# Patient Record
Sex: Male | Born: 1990 | Race: Black or African American | Hispanic: No | Marital: Single | Smoking: Never smoker
Health system: Southern US, Community
[De-identification: ages and names within clinical notes are randomized; demographics above are authoritative.]

---

## 2020-02-21 ENCOUNTER — Encounter: Payer: Self-pay | Admitting: Emergency Medicine

## 2020-02-21 ENCOUNTER — Emergency Department: Payer: BC Managed Care – PPO | Admitting: Anesthesiology

## 2020-02-21 ENCOUNTER — Encounter
Admission: EM | Disposition: A | Discharge status: Home / Self Care | Payer: Self-pay | Source: Home / Self Care | Attending: Emergency Medicine

## 2020-02-21 ENCOUNTER — Ambulatory Visit
Admission: EM | Admit: 2020-02-21 | Discharge: 2020-02-21 | Disposition: A | Discharge status: Home / Self Care | Payer: BC Managed Care – PPO | Attending: Family Medicine | Admitting: Family Medicine

## 2020-02-21 ENCOUNTER — Ambulatory Visit
Admission: RE | Admit: 2020-02-21 | Discharge: 2020-02-21 | Disposition: A | Discharge status: Home / Self Care | Payer: BC Managed Care – PPO | Source: Ambulatory Visit | Attending: Family Medicine | Admitting: Family Medicine

## 2020-02-21 ENCOUNTER — Other Ambulatory Visit: Payer: Self-pay

## 2020-02-21 ENCOUNTER — Ambulatory Visit
Admission: EM | Admit: 2020-02-21 | Discharge: 2020-02-22 | Disposition: A | Discharge status: Home / Self Care | Payer: BC Managed Care – PPO | Attending: Emergency Medicine | Admitting: Emergency Medicine

## 2020-02-21 DIAGNOSIS — N4400 Torsion of testis, unspecified: Secondary | ICD-10-CM | POA: Diagnosis not present

## 2020-02-21 DIAGNOSIS — N5089 Other specified disorders of the male genital organs: Secondary | ICD-10-CM

## 2020-02-21 DIAGNOSIS — N50811 Right testicular pain: Secondary | ICD-10-CM | POA: Insufficient documentation

## 2020-02-21 DIAGNOSIS — N433 Hydrocele, unspecified: Secondary | ICD-10-CM | POA: Insufficient documentation

## 2020-02-21 DIAGNOSIS — Z20822 Contact with and (suspected) exposure to covid-19: Secondary | ICD-10-CM | POA: Diagnosis not present

## 2020-02-21 HISTORY — PX: SCROTAL EXPLORATION: SHX2386

## 2020-02-21 HISTORY — PX: ORCHIECTOMY: SHX2116

## 2020-02-21 HISTORY — PX: ORCHIOPEXY: SHX479

## 2020-02-21 LAB — BASIC METABOLIC PANEL
Anion gap: 14 (ref 5–15)
BUN: 15 mg/dL (ref 6–20)
CO2: 25 mmol/L (ref 22–32)
Calcium: 9.1 mg/dL (ref 8.9–10.3)
Chloride: 105 mmol/L (ref 98–111)
Creatinine, Ser: 1.01 mg/dL (ref 0.61–1.24)
GFR calc Af Amer: >60 mL/min (ref >60)
GFR calc non Af Amer: >60 mL/min (ref >60)
Glucose, Bld: 100 mg/dL — ABNORMAL HIGH (ref 70–99)
Potassium: 3.8 mmol/L (ref 3.5–5.1)
Sodium: 144 mmol/L (ref 135–145)

## 2020-02-21 LAB — CBC
HCT: 39.8 % (ref 39.0–52.0)
Hemoglobin: 12.5 g/dL — ABNORMAL LOW (ref 13.0–17.0)
MCH: 22 pg — ABNORMAL LOW (ref 26.0–34.0)
MCHC: 31.4 g/dL (ref 30.0–36.0)
MCV: 69.9 fL — ABNORMAL LOW (ref 80.0–100.0)
Platelets: 245 10*3/uL (ref 150–400)
RBC: 5.69 MIL/uL (ref 4.22–5.81)
RDW: 15.6 % — ABNORMAL HIGH (ref 11.5–15.5)
WBC: 7.9 10*3/uL (ref 4.0–10.5)
nRBC: 0 % (ref 0.0–0.2)

## 2020-02-21 LAB — SARS CORONAVIRUS 2 BY RT PCR (HOSPITAL ORDER, PERFORMED IN ~~LOC~~ HOSPITAL LAB): SARS Coronavirus 2: NEGATIVE

## 2020-02-21 SURGERY — ORCHIOPEXY ADULT
Anesthesia: General | Site: Scrotum | Laterality: Right

## 2020-02-21 MED ORDER — DOCUSATE SODIUM 100 MG PO CAPS: 100.0000 mg | ORAL_CAPSULE | Freq: Two times a day (BID) | ORAL | 0 refills | Status: DC

## 2020-02-21 MED ORDER — CEFAZOLIN (ANCEF) 1 G IV SOLR: 2.0000 g | INTRAVENOUS | Status: DC

## 2020-02-21 MED ORDER — ONDANSETRON HCL 4 MG/2ML IJ SOLN
INTRAMUSCULAR | Status: DC | PRN
  Administered 2020-02-21: 4 mg via INTRAVENOUS

## 2020-02-21 MED ORDER — MIDAZOLAM HCL 2 MG/2ML IJ SOLN
INTRAMUSCULAR | Status: DC | PRN
  Administered 2020-02-21: 2 mg via INTRAVENOUS

## 2020-02-21 MED ORDER — HYDROCODONE-ACETAMINOPHEN 5-325 MG PO TABS: 1.0000 | ORAL_TABLET | Freq: Four times a day (QID) | ORAL | 0 refills | Status: DC | PRN

## 2020-02-21 MED ORDER — ONDANSETRON HCL 4 MG/2ML IJ SOLN
INTRAMUSCULAR | Status: AC
  Filled 2020-02-21: qty 2

## 2020-02-21 MED ORDER — BUPIVACAINE HCL 0.5 % IJ SOLN
INTRAMUSCULAR | Status: DC | PRN
  Administered 2020-02-21: 10 mL

## 2020-02-21 MED ORDER — LACTATED RINGERS IV SOLN: INTRAVENOUS | Status: DC | PRN

## 2020-02-21 MED ORDER — CEFAZOLIN SODIUM-DEXTROSE 2-4 GM/100ML-% IV SOLN
2.0000 g | Freq: Once | INTRAVENOUS | Status: AC
  Administered 2020-02-21: 2 g via INTRAVENOUS
  Filled 2020-02-21 (×2): qty 100

## 2020-02-21 MED ORDER — ACETAMINOPHEN 10 MG/ML IV SOLN
INTRAVENOUS | Status: AC
  Filled 2020-02-21: qty 100

## 2020-02-21 MED ORDER — FENTANYL CITRATE (PF) 100 MCG/2ML IJ SOLN
INTRAMUSCULAR | Status: AC
  Filled 2020-02-21: qty 2

## 2020-02-21 MED ORDER — DEXAMETHASONE SODIUM PHOSPHATE 10 MG/ML IJ SOLN
INTRAMUSCULAR | Status: AC
  Filled 2020-02-21: qty 1

## 2020-02-21 MED ORDER — BUPIVACAINE HCL (PF) 0.5 % IJ SOLN
INTRAMUSCULAR | Status: AC
  Filled 2020-02-21: qty 30

## 2020-02-21 MED ORDER — PROPOFOL 10 MG/ML IV BOLUS
INTRAVENOUS | Status: AC
  Filled 2020-02-21: qty 20

## 2020-02-21 MED ORDER — DEXMEDETOMIDINE (PRECEDEX) IN NS 20 MCG/5ML (4 MCG/ML) IV SYRINGE
PREFILLED_SYRINGE | INTRAVENOUS | Status: AC
  Filled 2020-02-21: qty 5

## 2020-02-21 MED ORDER — DEXAMETHASONE SODIUM PHOSPHATE 10 MG/ML IJ SOLN
INTRAMUSCULAR | Status: DC | PRN
  Administered 2020-02-21: 10 mg via INTRAVENOUS

## 2020-02-21 MED ORDER — LIDOCAINE HCL (CARDIAC) PF 100 MG/5ML IV SOSY
PREFILLED_SYRINGE | INTRAVENOUS | Status: DC | PRN
  Administered 2020-02-21: 80 mg via INTRAVENOUS

## 2020-02-21 MED ORDER — MIDAZOLAM HCL 2 MG/2ML IJ SOLN
INTRAMUSCULAR | Status: AC
  Filled 2020-02-21: qty 2

## 2020-02-21 MED ORDER — FENTANYL CITRATE (PF) 100 MCG/2ML IJ SOLN
INTRAMUSCULAR | Status: DC | PRN
Start: 2020-02-21 — End: 2020-02-22
  Administered 2020-02-21 (×4): 50 ug via INTRAVENOUS

## 2020-02-21 MED ORDER — SUCCINYLCHOLINE CHLORIDE 200 MG/10ML IV SOSY
PREFILLED_SYRINGE | INTRAVENOUS | Status: AC
  Filled 2020-02-21: qty 10

## 2020-02-21 MED ORDER — PROPOFOL 10 MG/ML IV BOLUS
INTRAVENOUS | Status: DC | PRN
  Administered 2020-02-21: 200 mg via INTRAVENOUS

## 2020-02-21 MED ORDER — ACETAMINOPHEN 10 MG/ML IV SOLN
INTRAVENOUS | Status: DC | PRN
  Administered 2020-02-21: 1000 mg via INTRAVENOUS

## 2020-02-21 MED ORDER — DEXMEDETOMIDINE HCL 200 MCG/2ML IV SOLN
INTRAVENOUS | Status: DC | PRN
  Administered 2020-02-21: 8 ug via INTRAVENOUS
  Administered 2020-02-21: 12 ug via INTRAVENOUS

## 2020-02-21 SURGICAL SUPPLY — 59 items
BLADE CLIPPER SURG (BLADE) ×4 IMPLANT
BLADE SURG 15 STRL LF DISP TIS (BLADE) ×3 IMPLANT
BLADE SURG 15 STRL SS (BLADE) ×1
CANISTER SUCT 1200ML W/VALVE (MISCELLANEOUS) ×4 IMPLANT
CHLORAPREP W/TINT 26 (MISCELLANEOUS) ×8 IMPLANT
COVER WAND RF STERILE (DRAPES) IMPLANT
DERMABOND ADVANCED (GAUZE/BANDAGES/DRESSINGS) ×1
DERMABOND ADVANCED .7 DNX12 (GAUZE/BANDAGES/DRESSINGS) ×3 IMPLANT
DRAIN PENROSE 1/4X12 LTX STRL (WOUND CARE) ×4 IMPLANT
DRAIN PENROSE 5/8X18 LTX STRL (WOUND CARE) IMPLANT
DRAPE LAPAROTOMY 77X122 PED (DRAPES) ×4 IMPLANT
DRSG GAUZE FLUFF 36X18 (GAUZE/BANDAGES/DRESSINGS) ×4 IMPLANT
DRSG TEGADERM 4X4.75 (GAUZE/BANDAGES/DRESSINGS) IMPLANT
DRSG TELFA 4X3 1S NADH ST (GAUZE/BANDAGES/DRESSINGS) IMPLANT
ELECT REM PT RETURN 9FT ADLT (ELECTROSURGICAL) ×4
ELECTRODE REM PT RTRN 9FT ADLT (ELECTROSURGICAL) ×3 IMPLANT
GAUZE 4X4 16PLY RFD (DISPOSABLE) ×4 IMPLANT
GEL ULTRASOUND 8.5OZ AQUASONIC (MISCELLANEOUS) ×4 IMPLANT
GLOVE BIO SURGEON STRL SZ 6.5 (GLOVE) ×8 IMPLANT
GLOVE BIOGEL PI IND STRL 6.5 (GLOVE) IMPLANT
GLOVE BIOGEL PI INDICATOR 6.5 (GLOVE)
GOWN STRL REUS W/ TWL LRG LVL3 (GOWN DISPOSABLE) ×6 IMPLANT
GOWN STRL REUS W/TWL LRG LVL3 (GOWN DISPOSABLE) ×2
KIT TURNOVER KIT A (KITS) ×4 IMPLANT
LABEL OR SOLS (LABEL) ×4 IMPLANT
NDL HPO THNWL 1X22GA REG BVL (NEEDLE) ×3 IMPLANT
NEEDLE HYPO 25X1 1.5 SAFETY (NEEDLE) ×4 IMPLANT
NEEDLE SAFETY 22GX1 (NEEDLE) ×1
NS IRRIG 500ML POUR BTL (IV SOLUTION) ×4 IMPLANT
PACK BASIN MINOR (MISCELLANEOUS) ×4 IMPLANT
SOL PREP PVP 2OZ (MISCELLANEOUS)
SOLUTION PREP PVP 2OZ (MISCELLANEOUS) IMPLANT
SPONGE KITTNER 5P (MISCELLANEOUS) IMPLANT
STRIP CLOSURE SKIN 1/2X4 (GAUZE/BANDAGES/DRESSINGS) IMPLANT
SUPPORETR ATHLETIC LG (MISCELLANEOUS) IMPLANT
SUPPORTER ATHLETIC LG (MISCELLANEOUS)
SUT CHROMIC 3 0 PS 2 (SUTURE) ×4 IMPLANT
SUT CHROMIC 4 0 RB 1X27 (SUTURE) IMPLANT
SUT ETHIBOND 3 0 SH 1 (SUTURE) ×8 IMPLANT
SUT ETHILON 3-0 FS-10 30 BLK (SUTURE) ×4
SUT MNCRL 4-0 (SUTURE)
SUT MNCRL 4-0 27XMFL (SUTURE)
SUT MON AB 3-0 SH 27 (SUTURE) IMPLANT
SUT PROLENE 4 0 PS 2 18 (SUTURE) IMPLANT
SUT SILK 0 (SUTURE) ×1
SUT SILK 0 30XBRD TIE 6 (SUTURE) ×3 IMPLANT
SUT SILK 0 SH 30 (SUTURE) ×4 IMPLANT
SUT SILK 2 0 SH (SUTURE) IMPLANT
SUT VIC AB 3-0 SH 27 (SUTURE) ×1
SUT VIC AB 3-0 SH 27X BRD (SUTURE) ×3 IMPLANT
SUT VIC AB 4-0 FS2 27 (SUTURE) IMPLANT
SUT VIC AB 4-0 SH 27 (SUTURE) ×1
SUT VIC AB 4-0 SH 27XANBCTRL (SUTURE) ×3 IMPLANT
SUT VICRYL PLUS ABS 0 54 (SUTURE) IMPLANT
SUTURE EHLN 3-0 FS-10 30 BLK (SUTURE) ×3 IMPLANT
SUTURE MNCRL 4-0 27XMF (SUTURE) IMPLANT
SYR 10ML LL (SYRINGE) ×4 IMPLANT
SYR 20ML LL LF (SYRINGE) ×4 IMPLANT
WATER STERILE IRR 1000ML POUR (IV SOLUTION) ×4 IMPLANT

## 2020-02-21 NOTE — ED Notes (Signed)
Pt to OR.

## 2020-02-21 NOTE — Discharge Instructions (Signed)
I will call with the results.  Go straight to the hospital for [place].  Take care  Dr. Adriana Simas

## 2020-02-21 NOTE — ED Provider Notes (Signed)
[facility]    CSN: [id number] Arrival date & time: 02/21/20  1716      History   Chief Complaint Chief Complaint  Patient presents with  . Groin Swelling   HPI  29 year old male presents with right testicle swelling.  Patient reports that this started 3 to 4 days ago.  He reports he developed right-sided testicular pain and associated scrotal swelling.  He states that his pain is currently 7/10 in severity.  Patient states that he thought that this would slightly improved but it has not.  He is sexually active.  He is not using protection.  No pain of the left testicle.  No penile discharge.  No urinary symptoms.  No other complaints at this time.  Home Medications    Prior to Admission medications   Not on File   Social History Social History   Tobacco Use  . Smoking status: Never Smoker  . Smokeless tobacco: Never Used  Substance Use Topics  . Alcohol use: Not Currently  . Drug use: Never     Allergies   Patient has no known allergies.   Review of Systems Review of Systems  Genitourinary: Positive for scrotal swelling and testicular pain.   Physical Exam Triage Vital Signs ED Triage Vitals  Enc Vitals Group     BP 02/21/20 1740 (!) 159/86     Pulse Rate 02/21/20 1740 81     Resp 02/21/20 1740 18     Temp 02/21/20 1740 98.5 F (36.9 C)     Temp Source 02/21/20 1740 Oral     SpO2 02/21/20 1740 100 %     Weight 02/21/20 1741 171 lb (77.6 kg)     Height 02/21/20 1741 6\' 2"  (1.88 m)     Head Circumference --      Peak Flow --      Pain Score 02/21/20 1740 7     Pain Loc --      Pain Edu? --      Excl. in GC? --    Updated Vital Signs BP (!) 159/86   Pulse 81   Temp 98.5 F (36.9 C) (Oral)   Resp 18   Ht 6\' 2"  (1.88 m)   Wt 77.6 kg   SpO2 100%   BMI 21.96 kg/m   Visual Acuity Right Eye Distance:   Left Eye Distance:   Bilateral Distance:    Right Eye Near:   Left Eye Near:    Bilateral Near:     Physical Exam Vitals and  nursing note reviewed.  Constitutional:      General: He is not in acute distress.    Appearance: Normal appearance. He is not ill-appearing.  Eyes:     General:        Right eye: No discharge.        Left eye: No discharge.     Conjunctiva/sclera: Conjunctivae normal.  Pulmonary:     Effort: Pulmonary effort is normal. No respiratory distress.  Genitourinary:    Comments: Left testicle normal.  Right testicle is very edematous.  Cannot really discern any of his anatomy due to the amount/extent of swelling. Neurological:     Mental Status: He is alert.  Psychiatric:        Mood and Affect: Mood normal.        Behavior: Behavior normal.    UC Treatments / Results  Labs (all labs ordered are listed, but only abnormal results are displayed) Labs Reviewed - No  data to display  EKG   Radiology US SCROTUM W/DOPPLER  Addendum Date: 02/21/2020   ADDENDUM REPORT: 02/21/2020 19:32 ADDENDUM: There is an error in the body of the report. It should state that pulsed Doppler interrogation of both testicles demonstrates normal venous and arterial waveforms within the left testicle. However on the right, no venous or arterial waveforms could be obtained. On power Doppler, no flow is seen within the right testicle. These findings are consistent with RIGHT-sided testicular torsion. These findings were discussed with Dr. Adriana Simas at the time of the correction of this report. Electronically Signed   By: Katherine Mantle M.D.   On: 02/21/2020 19:32   Addendum Date: 02/21/2020   ADDENDUM REPORT: 02/21/2020 19:24 ADDENDUM: These results were called by telephone at the time of interpretation on 02/21/2020 at 7:24 pm to provider [facility] , who verbally acknowledged these results. Electronically Signed   By: Katherine Mantle M.D.   On: 02/21/2020 19:24   Result Date: 02/21/2020 CLINICAL DATA:  Right testicular pain EXAM: SCROTAL ULTRASOUND DOPPLER ULTRASOUND OF THE TESTICLES TECHNIQUE: Complete ultrasound  examination of the testicles, epididymis, and other scrotal structures was performed. Color and spectral Doppler ultrasound were also utilized to evaluate blood flow to the testicles. COMPARISON:  None. FINDINGS: Right testicle Measurements: 4.3 x 3.2 x 4 cm. The right testicle is heterogeneous without evidence for a testicular mass. Left testicle Measurements: 5.9 x 2.8 x 2.6 cm. No mass or microlithiasis visualized. Right epididymis:  Normal in size and appearance. Left epididymis:  Normal in size and appearance. Hydrocele:  There is a complex right-sided hydrocele. Varicocele:  None visualized. Pulsed Doppler interrogation of both testes demonstrates normal low resistance arterial and venous waveforms bilaterally. There is asymmetric scrotal wall thickening involving the right hemiscrotum. IMPRESSION: Findings are consistent with right-sided testicular torsion. Electronically Signed: By: Katherine Mantle M.D. On: 02/21/2020 19:18    Procedures Procedures (including critical care time)  Medications Ordered in UC Medications - No data to display  Initial Impression / Assessment and Plan / UC Course  I have reviewed the triage vital signs and the nursing notes.  Pertinent labs & imaging results that were available during my care of the patient were reviewed by me and considered in my medical decision making (see chart for details).    29 year old male presents with right testicle pain and swelling.  Ultrasound was obtained.  Ultrasound revealed testicular torsion.  There was some initial confusion as there was an error in the report initially.  This has been corrected.  I have spoken with the on-call urologist regarding his case.  He is currently in the ER (was sent straight from [place]).  Patient will need an orchiectomy.  Final Clinical Impressions(s) / UC Diagnoses   Final diagnoses:  Swelling of right testicle  Pain in right testicle     Discharge Instructions     I will call with the  results.  Go straight to the hospital for [place].  Take care  Dr. Adriana Simas    ED Prescriptions    None     PDMP not reviewed this encounter.   Tommie Sams, [place] 02/21/20 1948

## 2020-02-21 NOTE — H&P (Signed)
Urology Consult  I have been asked to see the patient by Dr. Adriana Simas, for evaluation and management of right testiclar torsion.  Chief Complaint: right testicular pain  History of Present Illness: Gabriel Gamble is a 29 y.o. year old male with a personal history of severe right testicular pain x4 days presenting to the emergency room found to have evidence of testicular torsion.  He reports he had a sudden onset pain which woke him from his sleep about 4 days ago.  The pain is been constant, unrelenting, and failed to resolve.  No associated abdominal pain.  Scrotal swelling started about 2 days ago.  No urinary symptoms.  No fevers or chills.  He reports that he did not seek medical attention initially because he had the same thing happen several months back on the right that resolved after a few hours.  He is never had left testicular pain.  He is otherwise healthy has no medical problems and has never had surgery.  He takes no medications.  History reviewed. No pertinent past medical history.  History reviewed. No pertinent surgical history.  Home Medications:  None  Allergies: No Known Allergies  No family history on file.  Social History:  reports that he has never smoked. He has never used smokeless tobacco. He reports previous alcohol use. He reports that he does not use drugs.  ROS: A complete review of systems was performed.  All systems are negative except for pertinent findings as noted.  Physical Exam:  Vital signs in last 24 hours: Temp:  [98.5 F (36.9 C)-99.4 F (37.4 C)] 99.4 F (37.4 C) (09/22 1933) Pulse Rate:  [78-81] 78 (09/22 1933) Resp:  [18] 18 (09/22 1933) BP: (137-159)/(79-86) 137/79 (09/22 1933) SpO2:  [100 %] 100 % (09/22 1933) Weight:  [77.6 kg] 77.6 kg (09/22 1929) Constitutional:  Alert and oriented, No acute distress HEENT: [place] AT, moist mucus membranes.  Trachea midline, no masses Cardiovascular: Regular rate and rhythm, no clubbing,  cyanosis, or edema. Respiratory: Normal respiratory effort, lungs clear bilaterally GI: Abdomen is soft, nontender, nondistended, no abdominal masses GU: Normal phallus.  Left testicle normal.  Right-sided scrotal skin thickening with high riding testicle, diffusely firm, approximately baseball sized without fluctuance or crepitus. Neurologic: Grossly intact, no focal deficits, moving all 4 extremities Psychiatric: Normal mood and affect   Laboratory Data:  Recent Labs    02/21/20 1949  WBC 7.9  HGB 12.5*  HCT 39.8   Recent Labs    02/21/20 1949  NA 144  K 3.8  CL 105  CO2 25  GLUCOSE 100*  BUN 15  CREATININE 1.01  CALCIUM 9.1   No results for input(s): LABPT, INR in the last 72 hours. No results for input(s): LABURIN in the last 72 hours. No results found for this or any previous visit.   Radiologic Imaging: US SCROTUM W/DOPPLER  Addendum Date: 02/21/2020   ADDENDUM REPORT: 02/21/2020 19:32 ADDENDUM: There is an error in the body of the report. It should state that pulsed Doppler interrogation of both testicles demonstrates normal venous and arterial waveforms within the left testicle. However on the right, no venous or arterial waveforms could be obtained. On power Doppler, no flow is seen within the right testicle. These findings are consistent with RIGHT-sided testicular torsion. These findings were discussed with Dr. Adriana Simas at the time of the correction of this report. Electronically Signed   By: Katherine Mantle M.D.   On: 02/21/2020 19:32   Addendum  Date: 02/21/2020   ADDENDUM REPORT: 02/21/2020 19:24 ADDENDUM: These results were called by telephone at the time of interpretation on 02/21/2020 at 7:24 pm to provider [facility] , who verbally acknowledged these results. Electronically Signed   By: Katherine Mantle M.D.   On: 02/21/2020 19:24   Result Date: 02/21/2020 CLINICAL DATA:  Right testicular pain EXAM: SCROTAL ULTRASOUND DOPPLER ULTRASOUND OF THE TESTICLES  TECHNIQUE: Complete ultrasound examination of the testicles, epididymis, and other scrotal structures was performed. Color and spectral Doppler ultrasound were also utilized to evaluate blood flow to the testicles. COMPARISON:  None. FINDINGS: Right testicle Measurements: 4.3 x 3.2 x 4 cm. The right testicle is heterogeneous without evidence for a testicular mass. Left testicle Measurements: 5.9 x 2.8 x 2.6 cm. No mass or microlithiasis visualized. Right epididymis:  Normal in size and appearance. Left epididymis:  Normal in size and appearance. Hydrocele:  There is a complex right-sided hydrocele. Varicocele:  None visualized. Pulsed Doppler interrogation of both testes demonstrates normal low resistance arterial and venous waveforms bilaterally. There is asymmetric scrotal wall thickening involving the right hemiscrotum. IMPRESSION: Findings are consistent with right-sided testicular torsion. Electronically Signed: By: Katherine Mantle M.D. On: 02/21/2020 19:18    Scrotal ultrasound was personally reviewed.  I did also directly speak with Dr. Adriana Simas who is in communication with Dr. Chilton Si who made the above amendment.  Scar ultrasound was personally reviewed.  Agree with addended radiologic interpretation.  There is in fact no flow on the right of the testicle is in the heterogeneity is consistent with advanced necrosis of the right testicle.  Impression/Plan: 29 year old male with ischemic right testicle presumably related to a torsion episode 4 days ago.  We discussed today that based on his history, chronicity of events, as well as exam and ultrasound findings, the testicle is extremely unlikely to be salvageable.  I recommended proceeding to the operating room for scrotal exploration, probable right orchiectomy, simple: Left orchidopexy.    We discussed the risk of the procedure including risk of bleeding, infection, damage surrounding structures, testicular loss amongst others.  We reviewed that this  should not affect his fertility or his testosterone given he has a normal left testicle.  We discussed the role of orchidopexy of the contralateral testis to prevent torsion in the future.  We also reviewed that he is at higher risk for arm abscess/skin infection given the severe edema and probable necrosis.  We will likely leave a drain and consider starting him on antibiotics depending on intraoperative findings.  He will likely be able to be discharged later tonight.  He has a ride available.  Meds sent to a 24-hour pharmacy.  We discussed postoperative wound care.  All questions answered.  02/21/2020, 9:16 PM  Vanna Scotland,  MD

## 2020-02-21 NOTE — Anesthesia Preprocedure Evaluation (Signed)
Anesthesia Evaluation  Patient identified by MRN, date of birth, ID band Patient awake    Reviewed: Allergy & Precautions, NPO status , Patient's Chart, lab work & pertinent test results  History of Anesthesia Complications Negative for: history of anesthetic complications  Airway Mallampati: I  TM Distance: >3 FB Neck ROM: Full    Dental no notable dental hx. (+) Teeth Intact   Pulmonary neg pulmonary ROS, neg sleep apnea, neg COPD, Patient abstained from smoking.Not current smoker,    Pulmonary exam normal breath sounds clear to auscultation       Cardiovascular Exercise Tolerance: Good METS(-) hypertension(-) CAD and (-) Past MI negative cardio ROS  (-) dysrhythmias  Rhythm:Regular Rate:Normal - Systolic murmurs    Neuro/Psych negative neurological ROS  negative psych ROS   GI/Hepatic neg GERD  ,(+)     (-) substance abuse  ,   Endo/Other  neg diabetes  Renal/GU negative Renal ROS     Musculoskeletal   Abdominal   Peds  Hematology   Anesthesia Other Findings History reviewed. No pertinent past medical history.  Reproductive/Obstetrics                            Anesthesia Physical Anesthesia Plan  ASA: I  Anesthesia Plan: General   Post-op Pain Management:    Induction: Intravenous  PONV Risk Score and Plan: 3 and Ondansetron, Dexamethasone and Midazolam  Airway Management Planned: LMA  Additional Equipment: None  Intra-op Plan:   Post-operative Plan: Extubation in OR  Informed Consent: I have reviewed the patients History and Physical, chart, labs and discussed the procedure including the risks, benefits and alternatives for the proposed anesthesia with the patient or authorized representative who has indicated his/her understanding and acceptance.     Dental advisory given  Plan Discussed with: CRNA and Surgeon  Anesthesia Plan Comments: (Last NPO time  1pm. Discussed risks of anesthesia with patient, including PONV, sore throat, lip/dental damage. Rare risks discussed as well, such as cardiorespiratory and neurological sequelae. Patient understands.)        Anesthesia Quick Evaluation

## 2020-02-21 NOTE — ED Provider Notes (Signed)
[facility] Emergency Department Provider Note ____________________________________________   I have reviewed the triage vital signs and the nursing notes.   HISTORY  Chief Complaint Groin Swelling   History limited by: Not Limited   HPI Gabriel Gamble is a 29 y.o. male who presents to the emergency department today because of concerns for right testicular swelling and pain.  Patient states that the pain started suddenly 4 days ago.  It was severe.  He had been moving some was lifting a lot of boxes and furniture that day.  He states since then the swelling has continued all the pain has gradually gotten better.  Patient denies any fevers. Had US done as an outpatient which was consistent with right testicular torsion.   Records reviewed. Per medical record review no pertinent pat medical history.  There are no problems to display for this patient.   History reviewed. No pertinent surgical history.  Prior to Admission medications   Not on File    Allergies Patient has no known allergies.  No family history on file.  Social History Social History   Tobacco Use  . Smoking status: Never Smoker  . Smokeless tobacco: Never Used  Substance Use Topics  . Alcohol use: Not Currently  . Drug use: Never    Review of Systems Constitutional: No fever/chills Eyes: No visual changes. ENT: No sore throat. Cardiovascular: Denies chest pain. Respiratory: Denies shortness of breath. Gastrointestinal: No abdominal pain.  No nausea, no vomiting.  No diarrhea.   Genitourinary: Positive for right scrotal swelling and pain. Musculoskeletal: Negative for back pain. Skin: Negative for rash. Neurological: Negative for headaches, focal weakness or numbness.  ____________________________________________   PHYSICAL EXAM:  VITAL SIGNS: ED Triage Vitals  Enc Vitals Group     BP 02/21/20 1933 137/79     Pulse Rate 02/21/20 1933 78     Resp 02/21/20 1933 18      Temp 02/21/20 1933 99.4 F (37.4 C)     Temp Source 02/21/20 1933 Oral     SpO2 02/21/20 1933 100 %     Weight 02/21/20 1929 171 lb 1.2 oz (77.6 kg)     Height 02/21/20 1929 6\' 2"  (1.88 m)     Head Circumference --      Peak Flow --      Pain Score 02/21/20 1933 6    Constitutional: Alert and oriented.  Eyes: Conjunctivae are normal.  ENT      Head: Normocephalic and atraumatic.      Nose: No congestion/rhinnorhea.      Mouth/Throat: Mucous membranes are moist.      Neck: No stridor. Hematological/Lymphatic/Immunilogical: No cervical lymphadenopathy. Cardiovascular: Normal rate, regular rhythm.  No murmurs, rubs, or gallops. Respiratory: Normal respiratory effort without tachypnea nor retractions. Breath sounds are clear and equal bilaterally. No wheezes/rales/rhonchi. Gastrointestinal: Soft and non tender. No rebound. No guarding.  Genitourinary: Right scrotal swelling, firmness. No skin color changes. Tender to palpation. Musculoskeletal: Normal range of motion in all extremities. No lower extremity edema. Neurologic:  Normal speech and language. No gross focal neurologic deficits are appreciated.  Skin:  Skin is warm, dry and intact. No rash noted. Psychiatric: Mood and affect are normal. Speech and behavior are normal. Patient exhibits appropriate insight and judgment.  ____________________________________________    LABS (pertinent positives/negatives)  CBC wbc 7.9, hgb 12.5, plt 245 BMP wnl except glu 100  ____________________________________________   EKG  None  ____________________________________________    RADIOLOGY  None  ____________________________________________  PROCEDURES  Procedures  ____________________________________________   INITIAL IMPRESSION / ASSESSMENT AND PLAN / ED COURSE  Pertinent labs & imaging results that were available during my care of the patient were reviewed by me and considered in my medical decision making (see  chart for details).   Patient presented to the emergency department because of concerns for right testicular swelling and pain.  Ultrasound performed as outpatient is consistent with torsion.  Fortunately given that the symptoms have been going on for 4 days do have low suspicion that the testicle is salvageable.  Discussed with Dr. Apolinar Junes with urology who will plan on taking the patient to the operating room.  Discussed this with the patient.  ____________________________________________   FINAL CLINICAL IMPRESSION(S) / ED DIAGNOSES  Final diagnoses:  Testicular torsion     Note: This dictation was prepared with Dragon dictation. Any transcriptional errors that result from this process are unintentional     Phineas Semen, MD 02/21/20 2146

## 2020-02-21 NOTE — ED Triage Notes (Signed)
Patient ambulatory to triage with steady gait, without difficulty or distress noted; pt sent from u/s for dx rt testicular torsion

## 2020-02-21 NOTE — ED Triage Notes (Signed)
Pt reports having R sided scrotum swelling x3 days. No other symptoms at this time.

## 2020-02-22 ENCOUNTER — Encounter: Payer: Self-pay | Admitting: Urology

## 2020-02-22 MED ORDER — FENTANYL CITRATE (PF) 100 MCG/2ML IJ SOLN
INTRAMUSCULAR | Status: AC
  Administered 2020-02-22: 50 ug via INTRAVENOUS
  Filled 2020-02-22: qty 2

## 2020-02-22 MED ORDER — ONDANSETRON HCL 4 MG/2ML IJ SOLN: 4.0000 mg | Freq: Once | INTRAMUSCULAR | Status: DC | PRN

## 2020-02-22 MED ORDER — FENTANYL CITRATE (PF) 100 MCG/2ML IJ SOLN
25.0000 ug | INTRAMUSCULAR | Status: DC | PRN
  Administered 2020-02-22: 50 ug via INTRAVENOUS

## 2020-02-22 MED ORDER — OXYCODONE HCL 5 MG PO TABS
ORAL_TABLET | ORAL | Status: AC
  Administered 2020-02-22: 5 mg via ORAL
  Filled 2020-02-22: qty 1

## 2020-02-22 MED ORDER — OXYCODONE HCL 5 MG PO TABS: 5.0000 mg | ORAL_TABLET | Freq: Once | ORAL | Status: AC | PRN

## 2020-02-22 MED ORDER — ACETAMINOPHEN 10 MG/ML IV SOLN: 1000.0000 mg | Freq: Once | INTRAVENOUS | Status: DC | PRN

## 2020-02-22 MED ORDER — OXYCODONE HCL 5 MG/5ML PO SOLN: 5.0000 mg | Freq: Once | ORAL | Status: AC | PRN

## 2020-02-22 NOTE — Discharge Instructions (Signed)
Orchiectomy, Care After This sheet gives you information about how to care for yourself after your procedure. Your health care provider may also give you more specific instructions. If you have problems or questions, contact your health care provider. What can I expect after the procedure? After the procedure, it is common to have:  Pain.  Bruising.  Blood pooling (hematoma) in the area where your testicles were removed.  Depression or mood changes.  Fatigue.  Hot flashes. Follow these instructions at home: Managing pain and swelling  If directed, put ice on the affected area: ? Put ice in a plastic bag. ? Place a towel between your skin and the bag. ? Leave the ice on for 20 minutes, 2-3 times a day.  Wear scrotal support as told by your health care provider.  To relieve pressure and pain when sitting, you may use a donut cushion if directed by your health care provider. Incision care   Follow instructions from your health care provider about how to take care of your incision. Make sure you: ? Wash your hands with soap and water before you change your bandage (dressing). If soap and water are not available, use hand sanitizer. ? Change your dressing once a day, or as often as told by your health care provider. If the dressing sticks to your incision area:  Use warm, soapy water or hydrogen peroxide to dampen the dressing.  When the dressing becomes loose, lift it from the incision area. Make sure that the incision stays closed. ? Leave stitches (sutures), skin glue, or adhesive strips in place. These skin closures may need to stay in place for 2 weeks or longer. If adhesive strip edges start to loosen and curl up, you may trim the loose edges. Do not remove adhesive strips completely unless your health care provider tells you to do that.  Keep your dressing dry until it has been removed.  Check your incision area every day for signs of infection. Check for: ? More redness,  swelling, or pain. ? More fluid or blood. ? Warmth. ? Pus or a bad smell. Bathing  Do not take baths, swim, or use a hot tub until your health care provider approves. You may start taking showers two days after your procedure.  Do not rub your incision to dry it. Pat the area gently with a clean cloth or let it air-dry. Medicines  Take over-the-counter and prescription medicines only as told by your health care provider.  If you were prescribed an antibiotic medicine, use it as told by your health care provider. Do not stop using the antibiotic even if you start to feel better.  If you had both testicles removed, talk with your health care provider about medicine supplements to replace one of the male hormones (testosterone) that your body will no longer make. Driving  Do not drive for 24 hours if you were given a medicine to help you relax (sedative).  Do not drive or use heavy machinery while taking prescription pain medicines. Activity  Avoid activities that may cause your incision to open, such as jogging, playing sports, and straining with bowel movements. Ask your health care provider what activities are safe for you.  Do not lift anything that is heavier than 10 lb (4.5 kg), or the limit that your health care provider tells you, until he or she says that it is safe.  Do not engage in sexual activity until the area is healed and your health care provider approves.   This could take up to 4 weeks. General instructions  To prevent or treat constipation while you are taking prescription pain medicine, your health care provider may recommend that you: ? Drink enough fluid to keep your urine clear or pale yellow. ? Take over-the-counter or prescription medicines. ? Eat foods that are high in fiber, such as fresh fruits and vegetables, whole grains, and beans. ? Limit foods that are high in fat and processed sugars, such as fried and sweet foods.  Do not use any products that  contain nicotine or tobacco, such as cigarettes and e-cigarettes. If you need help quitting, ask your health care provider.  Keep all follow-up visits as told by your health care provider. This is important. Contact a health care provider if:  You have more pain, swelling, or redness in your genital or groin area.  You have more fluid or blood coming from your incision.  Your incision feels warm to the touch.  You have pus or a bad smell coming from your incision.  You have constipation that is not helped by changing your diet or drinking more fluid.  You develop nausea or vomiting.  You cannot eat or drink without vomiting. Get help right away if:  You have dizziness or nausea that does not go away.  You have trouble breathing.  You have a wet (congested) cough.  You have a fever or shaking chills.  Your incision breaks open after the skin closures have been removed.  You are not able to urinate. Summary  After this procedure, it is most common to have bruising or blood pooling in the area where the testicles were removed.  You should check your incision area every day for signs of infection, such as redness, swelling, pain, fluid, blood, warmth, pus, or a bad smell.  Avoid activities that may cause your incision to open, such as jogging, playing sports, and straining with bowel movements. Ask your health care provider what activities are safe for you.  You should not engage in sexual activity until the area is healed and your health care provider approves. This could take up to 4 weeks.  Men who have both testicles removed may have emotional and physical side effects. Your health care provider can help you with ways to manage those side effects. This information is not intended to replace advice given to you by your health care provider. Make sure you discuss any questions you have with your health care provider. Document Revised: 04/30/2017 Document Reviewed:  04/09/2016 Elsevier Patient Education  2020 [employer]    AMBULATORY SURGERY  DISCHARGE INSTRUCTIONS   1) The drugs that you were given will stay in your system until tomorrow so for the next 24 hours you should not:  A) Drive an automobile B) Make any legal decisions C) Drink any alcoholic beverage   2) You may resume regular meals tomorrow.  Today it is better to start with liquids and gradually work up to solid foods.  You may eat anything you prefer, but it is better to start with liquids, then soup and crackers, and gradually work up to solid foods.   3) Please notify your doctor immediately if you have any unusual bleeding, trouble breathing, redness and pain at the surgery site, drainage, fever, or pain not relieved by medication.    4) Additional Instructions:        Please contact your physician with any problems or Same Day Surgery at [phone], Monday through Friday 6 am to   4 pm, or [facility] [facility] number at [phone]. 

## 2020-02-22 NOTE — Anesthesia Postprocedure Evaluation (Signed)
Anesthesia Post Note  Patient: Gabriel Gamble  Procedure(s) Performed: ORCHIOPEXY ADULT (Bilateral Scrotum) Reduction of Testicular torsion (Right Scrotum) SCROTUM EXPLORATION (N/A Scrotum)  Patient location during evaluation: PACU Anesthesia Type: General Level of consciousness: awake and alert Pain management: pain level controlled Vital Signs Assessment: post-procedure vital signs reviewed and stable Respiratory status: spontaneous breathing, nonlabored ventilation, respiratory function stable and patient connected to nasal cannula oxygen Cardiovascular status: blood pressure returned to baseline and stable Postop Assessment: no apparent nausea or vomiting Anesthetic complications: no   No complications documented.   Last Vitals:  Vitals:   02/22/20 0048 02/22/20 0112  BP: (!) 142/92 (!) 151/95  Pulse: 66 76  Resp: 14 16  Temp: 36.8 C   SpO2: 100% 100%    Last Pain:  Vitals:   02/22/20 0112  TempSrc:   PainSc: 3                  Corinda Gubler

## 2020-02-22 NOTE — Op Note (Signed)
Date of procedure: 02/22/20  Preoperative diagnosis:  1. Right testicular torsion   Postoperative diagnosis:  1. same   Procedure: 1. Scrotal exploration 2. Bilateral orchidopexy  Surgeon: Vanna Scotland, MD  Anesthesia: General  Complications: None  Intraoperative findings: Right testicle enlarged, ischemic with 270 degree twist of the distal cord.  Extremely thickened tunica vaginalis with bloody hydrocele, loss of plane between the dartos and tunica vaginalis.  Upon delivery of the testicle, initially appeared grossly ischemic, dark purple in color but with reduction of the torsion, it began to turn pink.  Epididymis also abnormal, congested and ischemic.  Excellent pulse in the cord with evidence of bleeding with tunica albuginea puncture and transition from dark purple hue to a mottled light pink consistent with potentially salvageable testicle thus proceeded with orchidopexy.  Left testicle normal.  EBL: Minimal  Specimens: None  Drains: Quarter-inch Penrose  Indication: Gabriel Gamble is a 29 y.o. patient with 4-day history of severe right testicular pain with significant swelling over the past 2 days.  See H&P.  Exam and ultrasound as well as history consistent with testicular torsion..  After reviewing the management options for treatment, he elected to proceed with the above surgical procedure(s). We have discussed the potential benefits and risks of the procedure, side effects of the proposed treatment, the likelihood of the patient achieving the goals of the procedure, and any potential problems that might occur during the procedure or recuperation. Informed consent has been obtained.  Description of procedure:  The patient was taken to the operating room and general anesthesia was induced.  The patient was placed in the supine position, prepped and draped in the usual sterile fashion, and preoperative antibiotics were administered. A preoperative time-out was performed.    The scrotum was reexamined.  The right testicle was approximately baseball size, diffusely firm with overlying skin thickening which was nonmobile.  The left testicle was normal.  10 cc of local anesthetic was instilled into the median raphae.  A 15 blade was then used to incise the scrotal skin.  This incision was carried down through the midline to the subcutaneous tissues.  I began dissecting towards the right testicle however this dissection was extremely difficult as the skin was extremely adherent to the testicle itself and there was loss of plane between the tunica vaginalis and dartos.  I did find a good plane medially and carried this inferiorly and more sharply using Bovie electrocautery attempted to create a plane in order to ultimately free up the testicle within tunica albuginea.  Notably, the base of the penis was also in very close proximity to this inflammation during the dissection, and peeling the cord away from the base of the penis, the urethra was exposed.  There was no disruption of the urethra itself or involvement.  Ultimately, I was able to free up the testicle from the gubernaculum and deliver it into the field.  I ended up using 0 silk ties to tie off a few packets in order to help facilitate this.  I then opened the tunica vaginalis which was markedly thickened and abnormal.  There was approximately 30 cc of dark Merlot colored bloody fluid drained.  The testicle itself was then delivered into the field.  Initially, it appeared dark purple and completely ischemic.  I identified a twist in the cord just proximal to the level of the epididymis which was approximately 270 degrees.  Upon reducing this, the testicle almost immediately began to return to a mottled pink  color.  A few dark blue streaks remain in the testicle itself was not however there was evidence of return of blood flow.  Good pulse was palpated within the cord itself.  I ended up trying to use a Doppler to prove that  there was a dopplerable pulse within the testicle itself but it was not successful in finding 1.  That being said, throughout this, the testicle continue to transform with a more viable look.  Ultimately, I elected to perform orchidopexy Titus salvage this testicle.  I fixated the testicle in 3 points on either side laterally and then inferior with a 4-0 Ethibond suture securing it in place.  I did excise a small portion of the tunica vaginalis as it was relatively bulky and in the way of the fixation.  I have extensively cauterized the edges of the tunica vaginalis.  Next, the septum of the testicle was opened and the left testicle was delivered into the field within tunica vaginalis.  This testicle is normal.  Open tunica vaginalis and examined testicle which appeared viable healthy and normal.  I also fixated this testicle in three-point fixation as previously described using the same for Ethibond suture.  Once both testicles were secured in place, I performed extensive irrigation as well as try to achieve hemostasis.  I noticed a small buttonhole was created in the scrotal skin on the right dependent portion of the scrotum which I used as a opening for 1/4 inch Penrose drain.  This was carried in place with a drain stitch.  The scrotum was then closed as best possible using a running 3-0 Vicryl suture.  Notably, the integrity of the dark toes especially in the right side of the incision was somewhat questionable due to the significant inflammation and loss of plane as well as skin thickening.  Ultimately, this did close nicely and the skin was further closed using interrupted 3-0 chromic sutures.  Patient was then cleaned and dried and Dermabond was applied as well as scrotal fluffs and ABD pad and a scrotal support device.  He was then reversed from anesthesia and taken to the PACU in stable condition.  I called his contact letter know with the intraoperative findings as well as postoperative care.  He will  return on Friday for drain removal.  He is at high risk for skin dehiscence given the integrity of the scrotal skin.  Vanna Scotland, M.D.

## 2020-02-22 NOTE — Anesthesia Procedure Notes (Signed)
Procedure Name: LMA Insertion Date/Time: 02/21/2020 10:37 PM Performed by: Omer Jack, CRNA Pre-anesthesia Checklist: Patient identified, Patient being monitored, Timeout performed, Emergency Drugs available and Suction available Patient Re-evaluated:Patient Re-evaluated prior to induction Oxygen Delivery Method: Circle system utilized Preoxygenation: Pre-oxygenation with 100% oxygen Induction Type: IV induction Ventilation: Mask ventilation without difficulty LMA: LMA inserted LMA Size: 4.5 Tube type: Oral Number of attempts: 1 Placement Confirmation: positive ETCO2 and breath sounds checked- equal and bilateral Tube secured with: Tape Dental Injury: Teeth and Oropharynx as per pre-operative assessment

## 2020-02-22 NOTE — Brief Op Note (Signed)
02/21/2020  12:01 AM  PATIENT:  Gabriel Gamble  29 y.o. male  PRE-OPERATIVE DIAGNOSIS:  Right testicular torsion  POST-OPERATIVE DIAGNOSIS:  Right testicular torsion  PROCEDURE:  Procedure(s): ORCHIOPEXY ADULT (Bilateral) Reduction of Testicular torsion (Right) SCROTUM EXPLORATION (N/A)  SURGEON:  Surgeon(s) and Role:    Vanna Scotland, MD - Primary  ASSISTANTS: none   ANESTHESIA:   general  EBL:  Total I/O In: 200 [IV Piggyback:200] Out: -   Drains: penrose  Specimen: none  COUNTS CORRECT: YES  PLAN OF CARE: Discharge to home after PACU  PATIENT DISPOSITION:  PACU - hemodynamically stable.

## 2020-02-22 NOTE — Transfer of Care (Signed)
Immediate Anesthesia Transfer of Care Note  Patient: Gabriel Gamble  Procedure(s) Performed: ORCHIOPEXY ADULT (Bilateral Scrotum) Reduction of Testicular torsion (Right Scrotum) SCROTUM EXPLORATION (N/A Scrotum)  Patient Location: PACU  Anesthesia Type:General  Level of Consciousness: drowsy and patient cooperative  Airway & Oxygen Therapy: Patient Spontanous Breathing and Patient connected to nasal cannula oxygen  Post-op Assessment: Report given to RN and Post -op Vital signs reviewed and stable  Post vital signs: Reviewed and stable  Last Vitals:  Vitals Value Taken Time  BP 125/80 02/22/20 0018  Temp 36.5 C 02/22/20 0003  Pulse 77 02/22/20 0018  Resp 15 02/22/20 0019  SpO2 100 % 02/22/20 0018  Vitals shown include unvalidated device data.  Last Pain:  Vitals:   02/22/20 0018  TempSrc:   PainSc: Asleep         Complications: No complications documented.

## 2020-02-23 ENCOUNTER — Other Ambulatory Visit: Payer: Self-pay

## 2020-02-23 ENCOUNTER — Ambulatory Visit (INDEPENDENT_AMBULATORY_CARE_PROVIDER_SITE_OTHER): Payer: BC Managed Care – PPO | Admitting: Physician Assistant

## 2020-02-23 VITALS — BP 127/71 | HR 74 | Ht 74.0 in | Wt 176.2 lb

## 2020-02-23 DIAGNOSIS — Z4803 Encounter for change or removal of drains: Secondary | ICD-10-CM

## 2020-02-23 NOTE — Progress Notes (Signed)
Patient presented to clinic today for postop drain removal.  He is POD 1 from scrotal exploration and bilateral orchidopexy with Dr. Apolinar Junes for management of right testicular torsion.  He reports feeling well with some discomfort associated with sitting and applying pressure to his drain site.  He has been wearing a jockstrap since surgery.  Using sterile scissors and forceps, I snipped the securing stitch keeping the Penrose drain in place and removed both the stitch and the drain in their entirety.  Removal completed without difficulty, patient tolerated well.  I cleaned the drain site with Betadine and covered it with a sterile 4 x 4 gauze pad and secured in place with a Tegaderm.  Counseled patient to keep the drain site covered for 2 to 3 days and that thereafter, he can allow water to run over the wound.  Surgical incision noted to be intact throughout with no areas of dehiscence.  I explained to the patient that he is at risk of dehiscence per Dr. Marinell Blight note and to follow-up with [place] if his surgical wound reopens.  Reviewed scrotal support guidance today, including cryotherapy, scrotal compression, and scrotal elevation.  He expressed understanding.

## 2020-03-11 ENCOUNTER — Telehealth: Payer: Self-pay

## 2020-03-11 ENCOUNTER — Encounter: Payer: Self-pay | Admitting: Physician Assistant

## 2020-03-11 ENCOUNTER — Other Ambulatory Visit: Payer: Self-pay | Admitting: Radiology

## 2020-03-11 ENCOUNTER — Other Ambulatory Visit: Payer: Self-pay

## 2020-03-11 ENCOUNTER — Other Ambulatory Visit
Admission: RE | Admit: 2020-03-11 | Discharge: 2020-03-11 | Disposition: A | Discharge status: Home / Self Care | Payer: BC Managed Care – PPO | Source: Ambulatory Visit | Attending: Urology | Admitting: Urology

## 2020-03-11 ENCOUNTER — Ambulatory Visit (INDEPENDENT_AMBULATORY_CARE_PROVIDER_SITE_OTHER): Payer: BC Managed Care – PPO | Admitting: Physician Assistant

## 2020-03-11 VITALS — BP 124/75 | HR 94 | Ht 74.0 in | Wt 173.0 lb

## 2020-03-11 DIAGNOSIS — Z20822 Contact with and (suspected) exposure to covid-19: Secondary | ICD-10-CM | POA: Diagnosis not present

## 2020-03-11 DIAGNOSIS — T8130XA Disruption of wound, unspecified, initial encounter: Secondary | ICD-10-CM

## 2020-03-11 DIAGNOSIS — Z01818 Encounter for other preprocedural examination: Secondary | ICD-10-CM | POA: Insufficient documentation

## 2020-03-11 DIAGNOSIS — N5089 Other specified disorders of the male genital organs: Secondary | ICD-10-CM

## 2020-03-11 DIAGNOSIS — T8131XA Disruption of external operation (surgical) wound, not elsewhere classified, initial encounter: Secondary | ICD-10-CM

## 2020-03-11 LAB — SARS CORONAVIRUS 2 (TAT 6-24 HRS): SARS Coronavirus 2: NEGATIVE

## 2020-03-11 NOTE — Progress Notes (Signed)
 03/11/2020 5:16 PM   Gabriel Gamble 05/09/1991 [id number]  CC: Chief Complaint  Patient presents with  . Wound Check    HPI: Gabriel Gamble is a 29 y.o. male who underwent scrotal exploration and bilateral orchidopexy with Dr. Brandon on 02/22/2020 for management of right testicular torsion who presents today for wound recheck.  Patient was previously noted to be at high risk for dehiscence, however wound was intact at his postop drain removal appointment with me on POD 1.  He was counseled to follow-up in clinic with any wound reopening.  Today patient reports progressive enlargement of his surgical wound following drain removal.  He states that around 1 week ago, he began to be able to visualize a structure that he believes is his testicle inside the wound.  The wound has remained stable in size since and he returns to clinic today for further evaluation.  He denies fever, chills, nausea, vomiting, purulent drainage, erythema, and pain.  PMH: No past medical history on file.  Surgical History: Past Surgical History:  Procedure Laterality Date  . ORCHIECTOMY Right 02/21/2020   Procedure: Reduction of Testicular torsion;  Surgeon: Brandon, Ashley, MD;  Location: [facility] ORS;  Service: Urology;  Laterality: Right;  . ORCHIOPEXY Bilateral 02/21/2020   Procedure: ORCHIOPEXY ADULT;  Surgeon: Brandon, Ashley, MD;  Location: [facility] ORS;  Service: Urology;  Laterality: Bilateral;  . SCROTAL EXPLORATION N/A 02/21/2020   Procedure: SCROTUM EXPLORATION;  Surgeon: Brandon, Ashley, MD;  Location: [facility] ORS;  Service: Urology;  Laterality: N/A;    Home Medications:  Allergies as of 03/11/2020   No Known Allergies     Medication List       Accurate as of March 11, 2020  5:16 PM. If you have any questions, ask your nurse or doctor.        docusate sodium 100 MG capsule Commonly known as: COLACE Take 1 capsule (100 mg total) by mouth 2 (two) times daily.     HYDROcodone-acetaminophen 5-325 MG tablet Commonly known as: NORCO/VICODIN Take 1-2 tablets by mouth every 6 (six) hours as needed for moderate pain.       Allergies:  No Known Allergies  Family History: No family history on file.  Social History:   reports that he has never smoked. He has never used smokeless tobacco. He reports previous alcohol use. He reports that he does not use drugs.  Physical Exam: BP 124/75   Pulse 94   Ht 6' 2" (1.88 m)   Wt 173 lb (78.5 kg)   BMI 22.21 kg/m   Constitutional:  Alert and oriented, no acute distress, nontoxic appearing HEENT: [place], AT Cardiovascular: No clubbing, cyanosis, or edema Respiratory: Normal respiratory effort, no increased work of breathing GU: Complete dehiscence of the surgical wound.  Area of necrotic tissue visualized within the right hemiscrotum consistent with necrotic right testicle versus overlying tissue. Skin: No rashes, bruises or suspicious lesions Neurologic: Grossly intact, no focal deficits, moving all 4 extremities Psychiatric: Normal mood and affect  Assessment & Plan:   1. Postoperative wound dehiscence, initial encounter 29-year-old male presents with complete wound dehiscence following scrotal exploration with bilateral orchidopexy for management of right testicular torsion.  There is an area of necrotic tissue in the exposed wound, unclear on physical exam of this represents right testicular necrosis versus overlying necrotic tissue.  In consultation with Dr. Stoioff, we have elected to proceed with plans for scrotal exploration with possible right orchiectomy in the OR tomorrow.  Patient   is afebrile, VSS.  Given his stability, no indication for urgent intervention today.  Patient expressed understanding.  Booking sheet completed today, patient counseled to obtain preop Covid testing today.  Return in about 1 day (around 03/12/2020) for Scrotal exploration with possible right orchiectomy with Dr.  Lonna Cobb.  Carman Ching, PA-C  [facility] [address], [address] [place], [place] [address] [phone]

## 2020-03-11 NOTE — H&P (View-Only) (Signed)
03/11/2020 5:16 PM   Gabriel Gamble 04/20/1991 [id number]  CC: Chief Complaint  Patient presents with  . Wound Check    HPI: Gabriel Gamble is a 29 y.o. male who underwent scrotal exploration and bilateral orchidopexy with Dr. Apolinar Junes on 02/22/2020 for management of right testicular torsion who presents today for wound recheck.  Patient was previously noted to be at high risk for dehiscence, however wound was intact at his postop drain removal appointment with me on POD 1.  He was counseled to follow-up in clinic with any wound reopening.  Today patient reports progressive enlargement of his surgical wound following drain removal.  He states that around 1 week ago, he began to be able to visualize a structure that he believes is his testicle inside the wound.  The wound has remained stable in size since and he returns to clinic today for further evaluation.  He denies fever, chills, nausea, vomiting, purulent drainage, erythema, and pain.  PMH: No past medical history on file.  Surgical History: Past Surgical History:  Procedure Laterality Date  . ORCHIECTOMY Right 02/21/2020   Procedure: Reduction of Testicular torsion;  Surgeon: Vanna Scotland, MD;  Location: [facility];  Service: Urology;  Laterality: Right;  . ORCHIOPEXY Bilateral 02/21/2020   Procedure: ORCHIOPEXY ADULT;  Surgeon: Vanna Scotland, MD;  Location: [facility];  Service: Urology;  Laterality: Bilateral;  . SCROTAL EXPLORATION N/A 02/21/2020   Procedure: SCROTUM EXPLORATION;  Surgeon: Vanna Scotland, MD;  Location: [facility] OR[facility];  Service: Urology;  Laterality: N/A;    Home Medications:  Allergies as of 03/11/2020   No Known Allergies     Medication List       Accurate as of March 11, 2020  5:16 PM. If you have any questions, ask your nurse or doctor.        docusate sodium 100 MG capsule Commonly known as: COLACE Take 1 capsule (100 mg total) by mouth 2 (two) times daily.     HYDROcodone-acetaminophen 5-325 MG tablet Commonly known as: NORCO/VICODIN Take 1-2 tablets by mouth every 6 (six) hours as needed for moderate pain.       Allergies:  No Known Allergies  Family History: No family history on file.  Social History:   reports that he has never smoked. He has never used smokeless tobacco. He reports previous alcohol use. He reports that he does not use drugs.  Physical Exam: BP 124/75   Pulse 94   Ht 6\' 2"  (1.88 m)   Wt 173 lb (78.5 kg)   BMI 22.21 kg/m   Constitutional:  Alert and oriented, no acute distress, nontoxic appearing HEENT: [place], AT Cardiovascular: No clubbing, cyanosis, or edema Respiratory: Normal respiratory effort, no increased work of breathing GU: Complete dehiscence of the surgical wound.  Area of necrotic tissue visualized within the right hemiscrotum consistent with necrotic right testicle versus overlying tissue. Skin: No rashes, bruises or suspicious lesions Neurologic: Grossly intact, no focal deficits, moving all 4 extremities Psychiatric: Normal mood and affect  Assessment & Plan:   1. Postoperative wound dehiscence, initial encounter 29 year old male presents with complete wound dehiscence following scrotal exploration with bilateral orchidopexy for management of right testicular torsion.  There is an area of necrotic tissue in the exposed wound, unclear on physical exam of this represents right testicular necrosis versus overlying necrotic tissue.  In consultation with Dr. 37, we have elected to proceed with plans for scrotal exploration with possible right orchiectomy in the OR tomorrow.  Patient  is afebrile, VSS.  Given his stability, no indication for urgent intervention today.  Patient expressed understanding.  Booking sheet completed today, patient counseled to obtain preop Covid testing today.  Return in about 1 day (around 03/12/2020) for Scrotal exploration with possible right orchiectomy with Dr.  Lonna Cobb.  Carman Ching, PA-C  [facility] [address], [address] [place], [place] [address] [phone]

## 2020-03-11 NOTE — Telephone Encounter (Signed)
Patient called stating that his incision site post drain removal is more open and he believes he can see his testicle. He denies fever, chills, nausea, and not puss or drainage at site. Patient was added on to PA schedule today for further evaluation

## 2020-03-12 ENCOUNTER — Telehealth: Payer: Self-pay | Admitting: Urology

## 2020-03-12 ENCOUNTER — Ambulatory Visit
Admission: RE | Admit: 2020-03-12 | Discharge: 2020-03-12 | Disposition: A | Discharge status: Home / Self Care | Payer: BC Managed Care – PPO | Attending: Urology | Admitting: Urology

## 2020-03-12 ENCOUNTER — Encounter: Payer: Self-pay | Admitting: Urology

## 2020-03-12 ENCOUNTER — Encounter
Admission: RE | Disposition: A | Discharge status: Home / Self Care | Payer: Self-pay | Source: Home / Self Care | Attending: Urology

## 2020-03-12 ENCOUNTER — Other Ambulatory Visit: Payer: Self-pay

## 2020-03-12 ENCOUNTER — Ambulatory Visit: Payer: BC Managed Care – PPO | Admitting: Anesthesiology

## 2020-03-12 DIAGNOSIS — Y838 Other surgical procedures as the cause of abnormal reaction of the patient, or of later complication, without mention of misadventure at the time of the procedure: Secondary | ICD-10-CM | POA: Diagnosis not present

## 2020-03-12 DIAGNOSIS — T8130XA Disruption of wound, unspecified, initial encounter: Secondary | ICD-10-CM

## 2020-03-12 DIAGNOSIS — S3130XD Unspecified open wound of scrotum and testes, subsequent encounter: Secondary | ICD-10-CM | POA: Diagnosis not present

## 2020-03-12 DIAGNOSIS — N5089 Other specified disorders of the male genital organs: Secondary | ICD-10-CM | POA: Diagnosis not present

## 2020-03-12 DIAGNOSIS — T8131XA Disruption of external operation (surgical) wound, not elsewhere classified, initial encounter: Secondary | ICD-10-CM | POA: Insufficient documentation

## 2020-03-12 HISTORY — PX: ORCHIECTOMY: SHX2116

## 2020-03-12 HISTORY — PX: SCROTAL EXPLORATION: SHX2386

## 2020-03-12 SURGERY — EXPLORATION, SCROTUM
Anesthesia: General | Site: Scrotum | Laterality: Right

## 2020-03-12 MED ORDER — FENTANYL CITRATE (PF) 100 MCG/2ML IJ SOLN
INTRAMUSCULAR | Status: AC
Start: 2020-03-12 — End: ?
  Filled 2020-03-12: qty 2

## 2020-03-12 MED ORDER — ACETAMINOPHEN 10 MG/ML IV SOLN
INTRAVENOUS | Status: DC | PRN
  Administered 2020-03-12: 1000 mg via INTRAVENOUS

## 2020-03-12 MED ORDER — OXYCODONE HCL 5 MG/5ML PO SOLN: 5.0000 mg | Freq: Once | ORAL | Status: AC | PRN

## 2020-03-12 MED ORDER — DEXAMETHASONE SODIUM PHOSPHATE 10 MG/ML IJ SOLN
INTRAMUSCULAR | Status: DC | PRN
  Administered 2020-03-12: 10 mg via INTRAVENOUS

## 2020-03-12 MED ORDER — LIDOCAINE HCL (CARDIAC) PF 100 MG/5ML IV SOSY
PREFILLED_SYRINGE | INTRAVENOUS | Status: DC | PRN
  Administered 2020-03-12: 80 mg via INTRAVENOUS

## 2020-03-12 MED ORDER — ACETAMINOPHEN 10 MG/ML IV SOLN
INTRAVENOUS | Status: AC
  Filled 2020-03-12: qty 100

## 2020-03-12 MED ORDER — CEFAZOLIN SODIUM-DEXTROSE 1-4 GM/50ML-% IV SOLN
INTRAVENOUS | Status: AC
  Filled 2020-03-12: qty 50

## 2020-03-12 MED ORDER — CHLORHEXIDINE GLUCONATE 0.12 % MT SOLN
OROMUCOSAL | Status: AC
  Administered 2020-03-12: 15 mL via OROMUCOSAL
  Filled 2020-03-12: qty 15

## 2020-03-12 MED ORDER — HYDROCODONE-ACETAMINOPHEN 5-325 MG PO TABS: 1.0000 | ORAL_TABLET | Freq: Four times a day (QID) | ORAL | 0 refills | Status: DC | PRN

## 2020-03-12 MED ORDER — CHLORHEXIDINE GLUCONATE 0.12 % MT SOLN: 15.0000 mL | Freq: Once | OROMUCOSAL | Status: AC

## 2020-03-12 MED ORDER — FENTANYL CITRATE (PF) 100 MCG/2ML IJ SOLN
INTRAMUSCULAR | Status: AC
  Administered 2020-03-12: 25 ug via INTRAVENOUS
  Filled 2020-03-12: qty 2

## 2020-03-12 MED ORDER — OXYCODONE HCL 5 MG PO TABS: 5.0000 mg | ORAL_TABLET | Freq: Once | ORAL | Status: AC | PRN

## 2020-03-12 MED ORDER — LACTATED RINGERS IV SOLN: INTRAVENOUS | Status: DC

## 2020-03-12 MED ORDER — SULFAMETHOXAZOLE-TRIMETHOPRIM 800-160 MG PO TABS: 1.0000 | ORAL_TABLET | Freq: Two times a day (BID) | ORAL | 0 refills | Status: AC

## 2020-03-12 MED ORDER — ACETAMINOPHEN 10 MG/ML IV SOLN: 1000.0000 mg | Freq: Once | INTRAVENOUS | Status: DC | PRN

## 2020-03-12 MED ORDER — PROPOFOL 10 MG/ML IV BOLUS
INTRAVENOUS | Status: DC | PRN
  Administered 2020-03-12: 200 mg via INTRAVENOUS

## 2020-03-12 MED ORDER — MIDAZOLAM HCL 2 MG/2ML IJ SOLN
INTRAMUSCULAR | Status: DC | PRN
  Administered 2020-03-12: 2 mg via INTRAVENOUS

## 2020-03-12 MED ORDER — CEFAZOLIN SODIUM 1 G IJ SOLR
INTRAMUSCULAR | Status: AC
  Filled 2020-03-12: qty 10

## 2020-03-12 MED ORDER — ONDANSETRON HCL 4 MG/2ML IJ SOLN: 4.0000 mg | Freq: Once | INTRAMUSCULAR | Status: DC | PRN

## 2020-03-12 MED ORDER — FENTANYL CITRATE (PF) 100 MCG/2ML IJ SOLN
INTRAMUSCULAR | Status: DC | PRN
  Administered 2020-03-12 (×2): 25 ug via INTRAVENOUS
  Administered 2020-03-12 (×2): 50 ug via INTRAVENOUS

## 2020-03-12 MED ORDER — MIDAZOLAM HCL 2 MG/2ML IJ SOLN
INTRAMUSCULAR | Status: AC
  Filled 2020-03-12: qty 2

## 2020-03-12 MED ORDER — BUPIVACAINE HCL 0.5 % IJ SOLN
INTRAMUSCULAR | Status: DC | PRN
  Administered 2020-03-12: 8 mL

## 2020-03-12 MED ORDER — GLYCOPYRROLATE 0.2 MG/ML IJ SOLN
INTRAMUSCULAR | Status: DC | PRN
  Administered 2020-03-12: .2 mg via INTRAVENOUS

## 2020-03-12 MED ORDER — BUPIVACAINE HCL (PF) 0.5 % IJ SOLN
INTRAMUSCULAR | Status: AC
  Filled 2020-03-12: qty 30

## 2020-03-12 MED ORDER — ONDANSETRON HCL 4 MG/2ML IJ SOLN
INTRAMUSCULAR | Status: DC | PRN
  Administered 2020-03-12: 4 mg via INTRAVENOUS

## 2020-03-12 MED ORDER — OXYCODONE HCL 5 MG PO TABS
ORAL_TABLET | ORAL | Status: AC
  Administered 2020-03-12: 5 mg via ORAL
  Filled 2020-03-12: qty 1

## 2020-03-12 MED ORDER — DEXMEDETOMIDINE (PRECEDEX) IN NS 20 MCG/5ML (4 MCG/ML) IV SYRINGE
PREFILLED_SYRINGE | INTRAVENOUS | Status: DC | PRN
  Administered 2020-03-12: 4 ug via INTRAVENOUS
  Administered 2020-03-12 (×2): 8 ug via INTRAVENOUS

## 2020-03-12 MED ORDER — ORAL CARE MOUTH RINSE: 15.0000 mL | Freq: Once | OROMUCOSAL | Status: AC

## 2020-03-12 MED ORDER — PROPOFOL 10 MG/ML IV BOLUS
INTRAVENOUS | Status: AC
  Filled 2020-03-12: qty 20

## 2020-03-12 MED ORDER — CEFAZOLIN SODIUM-DEXTROSE 1-4 GM/50ML-% IV SOLN
1.0000 g | INTRAVENOUS | Status: AC
  Administered 2020-03-12: 2 g via INTRAVENOUS

## 2020-03-12 MED ORDER — KETOROLAC TROMETHAMINE 30 MG/ML IJ SOLN
INTRAMUSCULAR | Status: DC | PRN
  Administered 2020-03-12: 30 mg via INTRAVENOUS

## 2020-03-12 MED ORDER — FENTANYL CITRATE (PF) 100 MCG/2ML IJ SOLN
INTRAMUSCULAR | Status: AC
  Filled 2020-03-12: qty 2

## 2020-03-12 MED ORDER — FENTANYL CITRATE (PF) 100 MCG/2ML IJ SOLN
INTRAMUSCULAR | Status: AC
  Administered 2020-03-12: 50 ug via INTRAVENOUS
  Filled 2020-03-12: qty 2

## 2020-03-12 MED ORDER — FENTANYL CITRATE (PF) 100 MCG/2ML IJ SOLN
25.0000 ug | INTRAMUSCULAR | Status: DC | PRN
  Administered 2020-03-12: 50 ug via INTRAVENOUS

## 2020-03-12 SURGICAL SUPPLY — 52 items
ADH SKN CLS APL DERMABOND .7 (GAUZE/BANDAGES/DRESSINGS) ×1
APL PRP STRL LF DISP 70% ISPRP (MISCELLANEOUS) ×1
BLADE SURG 15 STRL LF DISP TIS (BLADE) ×1 IMPLANT
BLADE SURG 15 STRL SS (BLADE) ×2
CANISTER SUCT 1200ML W/VALVE (MISCELLANEOUS) ×2 IMPLANT
CHLORAPREP W/TINT 26 (MISCELLANEOUS) ×2 IMPLANT
CNTNR SPEC 2.5X3XGRAD LEK (MISCELLANEOUS)
CONT SPEC 4OZ STER OR WHT (MISCELLANEOUS)
CONT SPEC 4OZ STRL OR WHT (MISCELLANEOUS)
CONTAINER SPEC 2.5X3XGRAD LEK (MISCELLANEOUS) IMPLANT
COVER WAND RF STERILE (DRAPES) ×2 IMPLANT
DERMABOND ADVANCED (GAUZE/BANDAGES/DRESSINGS) ×1
DERMABOND ADVANCED .7 DNX12 (GAUZE/BANDAGES/DRESSINGS) ×1 IMPLANT
DRAIN PENROSE 1/4X12 LTX STRL (WOUND CARE) ×2 IMPLANT
DRAIN PENROSE 5/8X18 LTX STRL (WOUND CARE) ×2 IMPLANT
DRAPE LAPAROTOMY 77X122 PED (DRAPES) ×2 IMPLANT
DRSG GAUZE FLUFF 36X18 (GAUZE/BANDAGES/DRESSINGS) ×2 IMPLANT
DRSG TEGADERM 4X4.75 (GAUZE/BANDAGES/DRESSINGS) IMPLANT
DRSG TELFA 4X3 1S NADH ST (GAUZE/BANDAGES/DRESSINGS) IMPLANT
ELECT REM PT RETURN 9FT ADLT (ELECTROSURGICAL) ×2
ELECTRODE REM PT RTRN 9FT ADLT (ELECTROSURGICAL) ×1 IMPLANT
GAUZE 4X4 16PLY RFD (DISPOSABLE) ×2 IMPLANT
GLOVE BIOGEL PI IND STRL 7.5 (GLOVE) ×1 IMPLANT
GLOVE BIOGEL PI INDICATOR 7.5 (GLOVE) ×1
GOWN STRL REUS W/ TWL LRG LVL3 (GOWN DISPOSABLE) ×2 IMPLANT
GOWN STRL REUS W/ TWL XL LVL3 (GOWN DISPOSABLE) ×1 IMPLANT
GOWN STRL REUS W/TWL LRG LVL3 (GOWN DISPOSABLE) ×4
GOWN STRL REUS W/TWL XL LVL3 (GOWN DISPOSABLE) ×2
KIT TURNOVER KIT A (KITS) ×2 IMPLANT
LABEL OR SOLS (LABEL) ×2 IMPLANT
NEEDLE HYPO 25GX1X1/2 BEV (NEEDLE) ×2 IMPLANT
PACK BASIN MINOR (MISCELLANEOUS) ×2 IMPLANT
SPONGE KITTNER 5P (MISCELLANEOUS) IMPLANT
STRIP CLOSURE SKIN 1/2X4 (GAUZE/BANDAGES/DRESSINGS) IMPLANT
SUPPORETR ATHLETIC LG (MISCELLANEOUS) ×1 IMPLANT
SUPPORTER ATHLETIC LG (MISCELLANEOUS) ×2
SUT CHROMIC GUT BROWN 0 54 (SUTURE) IMPLANT
SUT CHROMIC GUT BROWN 0 54IN (SUTURE)
SUT ETHILON 3-0 FS-10 30 BLK (SUTURE) ×2
SUT MNCRL 4-0 (SUTURE) ×2
SUT MNCRL 4-0 27XMFL (SUTURE) ×1
SUT SILK 0 SH 30 (SUTURE) IMPLANT
SUT SILK 2 0 SH (SUTURE) ×2 IMPLANT
SUT VIC AB 0 CT1 36 (SUTURE) ×4 IMPLANT
SUT VIC AB 3-0 SH 27 (SUTURE) ×2
SUT VIC AB 3-0 SH 27X BRD (SUTURE) ×1 IMPLANT
SUT VIC AB 4-0 FS2 27 (SUTURE) ×2 IMPLANT
SUTURE EHLN 3-0 FS-10 30 BLK (SUTURE) ×1 IMPLANT
SUTURE MNCRL 4-0 27XMF (SUTURE) ×1 IMPLANT
SYR 10ML LL (SYRINGE) IMPLANT
SYR 20ML LL LF (SYRINGE) ×2 IMPLANT
WATER STERILE IRR 1000ML POUR (IV SOLUTION) ×2 IMPLANT

## 2020-03-12 NOTE — Interval H&P Note (Signed)
History and Physical Interval Note:  03/12/2020 2:29 PM  Lezlie Lye Melander  has presented today for surgery, with the diagnosis of wound dehiscence, possible testicular necrosis.  The various methods of treatment have been discussed with the patient and family. After consideration of risks, benefits and other options for treatment, the patient has consented to  Procedure(s): SCROTUM EXPLORATION (Right) ORCHIECTOMY (Right) as a surgical intervention.  The patient's history has been reviewed, patient examined, no change in status, stable for surgery.  I have reviewed the patient's chart and labs.  Questions were answered to the patient's satisfaction.     Samanta Gal C Lekeya Rollings

## 2020-03-12 NOTE — Anesthesia Preprocedure Evaluation (Signed)
Anesthesia Evaluation  Patient identified by MRN, date of birth, ID band Patient awake    Reviewed: Allergy & Precautions, NPO status , Patient's Chart, lab work & pertinent test results  History of Anesthesia Complications Negative for: history of anesthetic complications  Airway Mallampati: I  TM Distance: >3 FB Neck ROM: Full    Dental no notable dental hx. (+) Teeth Intact   Pulmonary neg pulmonary ROS, neg sleep apnea, neg COPD, Patient abstained from smoking.Not current smoker,    Pulmonary exam normal breath sounds clear to auscultation       Cardiovascular Exercise Tolerance: Good METS(-) hypertension(-) CAD and (-) Past MI negative cardio ROS  (-) dysrhythmias  Rhythm:Regular Rate:Normal - Systolic murmurs    Neuro/Psych negative neurological ROS  negative psych ROS   GI/Hepatic neg GERD  ,(+)     (-) substance abuse  ,   Endo/Other  neg diabetes  Renal/GU negative Renal ROS     Musculoskeletal   Abdominal   Peds  Hematology   Anesthesia Other Findings History reviewed. No pertinent past medical history.  Reproductive/Obstetrics                             Anesthesia Physical  Anesthesia Plan  ASA: I  Anesthesia Plan: General   Post-op Pain Management:    Induction: Intravenous  PONV Risk Score and Plan: 3 and Ondansetron, Dexamethasone and Midazolam  Airway Management Planned: LMA  Additional Equipment: None  Intra-op Plan:   Post-operative Plan: Extubation in OR  Informed Consent: I have reviewed the patients History and Physical, chart, labs and discussed the procedure including the risks, benefits and alternatives for the proposed anesthesia with the patient or authorized representative who has indicated his/her understanding and acceptance.     Dental advisory given  Plan Discussed with: CRNA and Surgeon  Anesthesia Plan Comments: (Last NPO time  1pm. Discussed risks of anesthesia with patient, including PONV, sore throat, lip/dental damage. Rare risks discussed as well, such as cardiorespiratory and neurological sequelae. Patient understands.)        Anesthesia Quick Evaluation

## 2020-03-12 NOTE — Telephone Encounter (Signed)
App made given to [facility] in PACU  [place]

## 2020-03-12 NOTE — Op Note (Signed)
Preoperative diagnosis:  1. Right hemiscrotal wound dehiscence  Postoperative diagnosis:  1. Right hemiscrotal wound dehiscence 2. Necrotic right testis  Procedure: 1. Scrotal exploration with debridement 2. Right orchiectomy  Surgeon: Riki Altes, MD  Anesthesia: General  Complications: None  Intraoperative findings:  1. Necrotic remnant right testis   EBL: Minimal  Specimens: Right testis  Indication: Gabriel Gamble is a 29 y.o. patient with a prior history of torsion of the right spermatic cord and underwent exploration and bilateral orchiopexy by Dr. Apolinar Junes on 02/22/2020.  The right testis visually appeared ischemic however with reduction the appearance improved to a mottled light pink consistent with a potentially salvageable testis.  It was felt to be at high risk for wound dehiscence based on inflammation in appearance of the scrotal skin. His drain was removed on 9/24 and the incision was intact.  Approximately 1 week ago he had progressive opening of his incision and thought he was able to visualize a structure that he thought to be the testicle.  He did not seek care until yesterday.    After reviewing the management options for treatment, he elected to proceed with the above surgical procedure(s). We have discussed the potential benefits and risks of the procedure, side effects of the proposed treatment, the likelihood of the patient achieving the goals of the procedure, and any potential problems that might occur during the procedure or recuperation. Informed consent has been obtained.  Description of procedure:  The patient was taken to the operating room and general anesthesia was induced.  The patient was placed in the supine position, prepped and draped in the usual sterile fashion, and preoperative antibiotics were administered. A preoperative time-out was performed.   Soft, brownish tissue with an apparent remnant of tunica albuginea was identified on the  lateral aspect of the operative site.  A 4-0 Ethibond suture was identified on the covering and this remnant did appear attached to what was felt to be the spermatic cord.  Using blunt and sharp dissection with cautery this area was isolated.  Dissecting superiorly the spermatic cord was identified and freed from surrounding inflammatory tissue and clamped with a hemostat.  The cord was divided and the stump was suture-ligated with 0 Vicryl suture.  Some additional dark appearing tissue was debrided from the wound.  Hemostasis was obtained with cautery.  The site was copiously irrigated with sterile saline.  The superior aspect of the wound opening was loosely reapproximated with interrupted 3-0 nylon suture and a packing was placed by the inferior aspect of the incision.  All sponge and needle counts were correct.  After anesthetic reversal he was transported to the PACU in stable condition.  Plan: He will be scheduled for follow-up 03/13/2020 for a dressing change.   Riki Altes, M.D.

## 2020-03-12 NOTE — Anesthesia Postprocedure Evaluation (Signed)
Anesthesia Post Note  Patient: Lezlie Lye Waltrip  Procedure(s) Performed: SCROTUM EXPLORATION (Right Scrotum) ORCHIECTOMY (Right Scrotum)  Patient location during evaluation: PACU Anesthesia Type: General Level of consciousness: awake and alert Pain management: pain level controlled Vital Signs Assessment: post-procedure vital signs reviewed and stable Respiratory status: spontaneous breathing, nonlabored ventilation, respiratory function stable and patient connected to nasal cannula oxygen Cardiovascular status: blood pressure returned to baseline and stable Postop Assessment: no apparent nausea or vomiting Anesthetic complications: no   No complications documented.   Last Vitals:  Vitals:   03/12/20 1618 03/12/20 1630  BP: 121/71 (!) 121/94  Pulse: 60 60  Resp: 17 16  Temp: (!) 36.2 C (!) 36.3 C  SpO2: 100% 100%    Last Pain:  Vitals:   03/12/20 1630  TempSrc:   PainSc: 2                  Lenard Simmer

## 2020-03-12 NOTE — Telephone Encounter (Signed)
-----   Message from Riki Altes, MD sent at 03/12/2020  3:55 PM EDT ----- Regarding: Dressing change Please schedule dressing change appointment for tomorrow 10/13 instead of 10/14.  Sorry

## 2020-03-12 NOTE — Anesthesia Procedure Notes (Signed)
Procedure Name: LMA Insertion Date/Time: 03/12/2020 2:41 PM Performed by: Hermenia Bers, CRNA Pre-anesthesia Checklist: Patient identified, Patient being monitored, Timeout performed, Emergency Drugs available and Suction available Patient Re-evaluated:Patient Re-evaluated prior to induction Oxygen Delivery Method: Circle system utilized Preoxygenation: Pre-oxygenation with 100% oxygen Induction Type: IV induction Ventilation: Mask ventilation without difficulty LMA: LMA inserted LMA Size: 4.5 Tube type: Oral Number of attempts: 1 Placement Confirmation: positive ETCO2 and breath sounds checked- equal and bilateral Tube secured with: Tape Dental Injury: Teeth and Oropharynx as per pre-operative assessment

## 2020-03-12 NOTE — Telephone Encounter (Signed)
appt corrected Gabriel Gamble

## 2020-03-12 NOTE — Telephone Encounter (Signed)
-----   Message from Riki Altes, MD sent at 03/12/2020  3:51 PM EDT ----- Please schedule PA visit Thursday 10/14 for dressing change

## 2020-03-12 NOTE — Transfer of Care (Signed)
Immediate Anesthesia Transfer of Care Note  Patient: Gabriel Gamble  Procedure(s) Performed: SCROTUM EXPLORATION (Right Scrotum) ORCHIECTOMY (Right Scrotum)  Patient Location: PACU  Anesthesia Type:General  Level of Consciousness: drowsy and responds to stimulation  Airway & Oxygen Therapy: Patient Spontanous Breathing and Patient connected to face mask oxygen  Post-op Assessment: Report given to RN and Post -op Vital signs reviewed and stable  Post vital signs: Reviewed and stable  Last Vitals:  Vitals Value Taken Time  BP 103/58 03/12/20 1533  Temp    Pulse 59 03/12/20 1536  Resp 18 03/12/20 1536  SpO2 98 % 03/12/20 1536  Vitals shown include unvalidated device data.  Last Pain:  Vitals:   03/12/20 1533  TempSrc:   PainSc: (P) Asleep         Complications: No complications documented.

## 2020-03-12 NOTE — Discharge Instructions (Signed)
AMBULATORY SURGERY  DISCHARGE INSTRUCTIONS   The drugs that you were given will stay in your system until tomorrow so for the next 24 hours you should not:  Drive an automobile Make any legal decisions Drink any alcoholic beverage   You may resume regular meals tomorrow.  Today it is better to start with liquids and gradually work up to solid foods.  You may eat anything you prefer, but it is better to start with liquids, then soup and crackers, and gradually work up to solid foods.   Please notify your doctor immediately if you have any unusual bleeding, trouble breathing, redness and pain at the surgery site, drainage, fever, or pain not relieved by medication.    Additional Instructions:         Please contact your physician with any problems or Same Day Surgery at [phone], Monday through Friday 6 am to 4 pm, or  at [facility] number at [phone].Pain medication and antibiotic Rx was sent to your pharmacy  Keep site dry until follow-up visit 03/13/2020  Call office for increasing pain or fever greater than 101 degrees  You will be scheduled for a dressing change on Thursday, 03/13/2020

## 2020-03-13 ENCOUNTER — Ambulatory Visit (INDEPENDENT_AMBULATORY_CARE_PROVIDER_SITE_OTHER): Payer: Self-pay | Admitting: Urology

## 2020-03-13 ENCOUNTER — Encounter: Payer: Self-pay | Admitting: Urology

## 2020-03-13 VITALS — BP 123/71 | HR 94 | Ht 74.0 in | Wt 173.0 lb

## 2020-03-13 DIAGNOSIS — T8130XA Disruption of wound, unspecified, initial encounter: Secondary | ICD-10-CM

## 2020-03-13 DIAGNOSIS — N5089 Other specified disorders of the male genital organs: Secondary | ICD-10-CM

## 2020-03-13 DIAGNOSIS — Z4801 Encounter for change or removal of surgical wound dressing: Secondary | ICD-10-CM

## 2020-03-13 MED ORDER — HYDROCODONE-ACETAMINOPHEN 5-325 MG PO TABS: 1.0000 | ORAL_TABLET | Freq: Four times a day (QID) | ORAL | 0 refills | Status: DC | PRN

## 2020-03-13 NOTE — Progress Notes (Signed)
03/13/2020 12:17 PM   Gabriel Gamble 1991/03/14 [id number]  Referring provider: Eartha Inch, MD [address] [place],  [place] [address]  Chief Complaint  Patient presents with  . Wound Check    HPI: Gabriel Gamble is a 29 y.o. male who is status post right scrotal exploration and right orchiectomy yesterday with Dr. Lonna Cobb who presents today for dressing change.   Brief history: a prior history of torsion of the right spermatic cord and underwent exploration and bilateral orchiopexy by Dr. Apolinar Junes on 02/22/2020.  The right testis visually appeared ischemic however with reduction the appearance improved to a mottled light pink consistent with a potentially salvageable testis.  It was felt to be at high risk for wound dehiscence based on inflammation in appearance of the scrotal skin. His drain was removed on 9/24 and the incision was intact.  Approximately 1 week ago he had progressive opening of his incision and thought he was able to visualize a structure that he thought to be the testicle.  He did not seek care until day prior to procedure.    He underwent a right scrotal exploration with debridement and a right orchiectomy of his necrotic right testes yesterday with Dr. Irineo Axon.  He had no acute events overnight.   PMH: No past medical history on file.  Surgical History: Past Surgical History:  Procedure Laterality Date  . ORCHIECTOMY Right 02/21/2020   Procedure: Reduction of Testicular torsion;  Surgeon: Vanna Scotland, MD;  Location: [facility];  Service: Urology;  Laterality: Right;  . ORCHIECTOMY Right 03/12/2020   Procedure: ORCHIECTOMY;  Surgeon: Riki Altes, MD;  Location: [facility];  Service: Urology;  Laterality: Right;  . ORCHIOPEXY Bilateral 02/21/2020   Procedure: ORCHIOPEXY ADULT;  Surgeon: Vanna Scotland, MD;  Location: [facility];  Service: Urology;  Laterality: Bilateral;  . SCROTAL EXPLORATION N/A 02/21/2020   Procedure: SCROTUM  EXPLORATION;  Surgeon: Vanna Scotland, MD;  Location: [facility];  Service: Urology;  Laterality: N/A;  . SCROTAL EXPLORATION Right 03/12/2020   Procedure: SCROTUM EXPLORATION;  Surgeon: Riki Altes, MD;  Location: [facility];  Service: Urology;  Laterality: Right;    Home Medications:  Allergies as of 03/13/2020   No Known Allergies     Medication List       Accurate as of March 13, 2020 12:17 PM. If you have any questions, ask your nurse or doctor.        docusate sodium 100 MG capsule Commonly known as: COLACE Take 1 capsule (100 mg total) by mouth 2 (two) times daily.   HYDROcodone-acetaminophen 5-325 MG tablet Commonly known as: NORCO/VICODIN Take 1-2 tablets by mouth every 6 (six) hours as needed for moderate pain.   sulfamethoxazole-trimethoprim 800-160 MG tablet Commonly known as: BACTRIM DS Take 1 tablet by mouth 2 (two) times daily for 5 days.       Allergies: No Known Allergies  Family History: No family history on file.  Social History:  reports that he has never smoked. He has never used smokeless tobacco. He reports previous alcohol use. He reports that he does not use drugs.  ROS: Pertinent ROS in HPI  Physical Exam: BP 123/71 (BP Location: Left Arm, Patient Position: Sitting, Cuff Size: Normal)   Pulse 94   Ht 6\' 2"  (1.88 m)   Wt 173 lb (78.5 kg)   BMI 22.21 kg/m   Constitutional:  Well nourished. Alert and oriented, No acute distress. HEENT: [place] AT, mask in  place.  Trachea midline Cardiovascular: No clubbing, cyanosis, or edema. Respiratory: Normal respiratory effort, no increased work of breathing. GU: Scrotum without lesions, cysts, rashes and/or edema.  Right hemiscrotum loosely approximated with suture superiorly with packing in the inferior portion of the incision.  Left testicle is located scrotally. No masses are appreciated in the testicle. Left epididymis is normal.  Packing is removed to reveal a deep incision with healthy granulation  tissue.  No crepitus, erythema or purulent drainage is noted.  Packing is inspected and noted to just have dried blood. Neurologic: Grossly intact, no focal deficits, moving all 4 extremities. Psychiatric: Normal mood and affect.  Laboratory Data: Lab Results  Component Value Date   WBC 7.9 02/21/2020   HGB 12.5 (L) 02/21/2020   HCT 39.8 02/21/2020   MCV 69.9 (L) 02/21/2020   PLT 245 02/21/2020    Lab Results  Component Value Date   CREATININE 1.01 02/21/2020   Urinalysis No results found for: COLORURINE, APPEARANCEUR, LABSPEC, PHURINE, GLUCOSEU, HGBUR, BILIRUBINUR, KETONESUR, PROTEINUR, UROBILINOGEN, NITRITE, LEUKOCYTESUR  I have reviewed the labs.   Pertinent Imaging: N/A  Packing is removed to reveal healthy granulation tissue.  The wound is then irrigated with normal saline.  It is then repacked using a 2 inch x 75 inch stretch bandage soaked in normal saline.  It is covered with 4 x 4 gauze.  And his supportive underwear is replaced.  Assessment & Plan:    1.  Necrotic right testes Patient is status post scrotal exploration and orchiectomy yesterday Dressing changes completed Patient return tomorrow for another dressing exchange He is advised to take a pain medication and have a driver prior to his appointment A refill on his Vicodin is given  Return for Tomorrow for dressing change.  These notes generated with voice recognition software. I apologize for typographical errors.  Michiel Cowboy, PA-C  [facility] [address]  [address] [place], [place] [address] [phone]

## 2020-03-13 NOTE — Progress Notes (Signed)
03/14/2020 12:00 PM   Gabriel Gamble 09/26/1990 [id number]  Referring provider: Eartha Inch, MD [address] [place],  [place] [address]  Chief Complaint  Patient presents with  . Follow-up    HPI: Gabriel Gamble is a 29 y.o. male who is status post right scrotal exploration and right orchiectomy yesterday with Dr. Lonna Cobb who presents today for dressing change.   Brief history: a prior history of torsion of the right spermatic cord and underwent exploration and bilateral orchiopexy by Dr. Apolinar Junes on 02/22/2020.  The right testis visually appeared ischemic however with reduction the appearance improved to a mottled light pink consistent with a potentially salvageable testis.  It was felt to be at high risk for wound dehiscence based on inflammation in appearance of the scrotal skin. His drain was removed on 9/24 and the incision was intact.  Approximately 1 week ago he had progressive opening of his incision and thought he was able to visualize a structure that he thought to be the testicle.  He did not seek care until day prior to procedure.    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 Irineo Axon.  He had no acute events overnight.   PMH: No past medical history on file.  Surgical History: Past Surgical History:  Procedure Laterality Date  . ORCHIECTOMY Right 02/21/2020   Procedure: Reduction of Testicular torsion;  Surgeon: Vanna Scotland, MD;  Location: [facility];  Service: Urology;  Laterality: Right;  . ORCHIECTOMY Right 03/12/2020   Procedure: ORCHIECTOMY;  Surgeon: Riki Altes, MD;  Location: [facility];  Service: Urology;  Laterality: Right;  . ORCHIOPEXY Bilateral 02/21/2020   Procedure: ORCHIOPEXY ADULT;  Surgeon: Vanna Scotland, MD;  Location: [facility];  Service: Urology;  Laterality: Bilateral;  . SCROTAL EXPLORATION N/A 02/21/2020   Procedure: SCROTUM  EXPLORATION;  Surgeon: Vanna Scotland, MD;  Location: [facility];  Service: Urology;  Laterality: N/A;  . SCROTAL EXPLORATION Right 03/12/2020   Procedure: SCROTUM EXPLORATION;  Surgeon: Riki Altes, MD;  Location: [facility];  Service: Urology;  Laterality: Right;    Home Medications:  Allergies as of 03/14/2020   No Known Allergies     Medication List       Accurate as of March 14, 2020 12:00 PM. If you have any questions, ask your nurse or doctor.        docusate sodium 100 MG capsule Commonly known as: COLACE Take 1 capsule (100 mg total) by mouth 2 (two) times daily.   HYDROcodone-acetaminophen 5-325 MG tablet Commonly known as: NORCO/VICODIN Take 1-2 tablets by mouth every 6 (six) hours as needed for moderate pain.   sulfamethoxazole-trimethoprim 800-160 MG tablet Commonly known as: BACTRIM DS Take 1 tablet by mouth 2 (two) times daily for 5 days.       Allergies: No Known Allergies  Family History: No family history on file.  Social History:  reports that he has never smoked. He has never used smokeless tobacco. He reports previous alcohol use. He reports that he does not use drugs.  ROS: Pertinent ROS in HPI  Physical Exam: Constitutional:  Well nourished. Alert and oriented, No acute distress. HEENT: [place] AT, mask in place.  Trachea midline Cardiovascular: No clubbing, cyanosis, or edema. Respiratory: Normal respiratory effort, no increased work of breathing. GU: No CVA tenderness.  No bladder fullness or masses.  Patient with circumcised phallus.  Urethral meatus is  patent.  No penile discharge.  No penile lesions or rashes.  Right hemiscrotum loosely approximated with suture superiorly with packing and inferior portion of the incision.  Left testicle is located scrotal he.  No masses are appreciated in the left testicle.  Left epididymitis normal.  Packing is removed to reveal a deep incision with healthy granulation tissue.  No crepitus, erythema or  purulent drainage is noted.  Packing is inspected and noted to have dried blood.   Neurologic: Grossly intact, no focal deficits, moving all 4 extremities. Psychiatric: Normal mood and affect.  Laboratory Data: Lab Results  Component Value Date   WBC 7.9 02/21/2020   HGB 12.5 (L) 02/21/2020   HCT 39.8 02/21/2020   MCV 69.9 (L) 02/21/2020   PLT 245 02/21/2020    Lab Results  Component Value Date   CREATININE 1.01 02/21/2020   Urinalysis No results found for: COLORURINE, APPEARANCEUR, LABSPEC, PHURINE, GLUCOSEU, HGBUR, BILIRUBINUR, KETONESUR, PROTEINUR, UROBILINOGEN, NITRITE, LEUKOCYTESUR  I have reviewed the labs.   Pertinent Imaging: N/A  Packing is removed to reveal healthy granulation tissue.  The wound is then irrigated with normal saline.  Is then repacked using a 2 inch x 75 and stretch bandage soaked in normal saline.  Is covered by 4 x 4 gauze and supportive underwear is replaced  Assessment & Plan:    1.  Necrotic right testes Patient is status post scrotal exploration and orchiectomy yesterday Dressing changes completed Patient return tomorrow for another dressing exchange He is advised to take a pain medication and have a driver prior to his appointment  Return for tomorrow for dressing change.  These notes generated with voice recognition software. I apologize for typographical errors.  Michiel Cowboy, PA-C  [facility] [address]  [address] [place], [place] [address] [phone]

## 2020-03-14 ENCOUNTER — Ambulatory Visit (INDEPENDENT_AMBULATORY_CARE_PROVIDER_SITE_OTHER): Payer: BC Managed Care – PPO | Admitting: Urology

## 2020-03-14 ENCOUNTER — Ambulatory Visit: Payer: BC Managed Care – PPO | Admitting: Physician Assistant

## 2020-03-14 ENCOUNTER — Other Ambulatory Visit: Payer: Self-pay

## 2020-03-14 DIAGNOSIS — Z4803 Encounter for change or removal of drains: Secondary | ICD-10-CM

## 2020-03-14 LAB — SURGICAL PATHOLOGY

## 2020-03-14 NOTE — Progress Notes (Signed)
03/15/2020 9:00 AM   Gabriel Gamble 1990/08/10 [id number]  Referring provider: Eartha Inch, MD [address] [place],  [place] [address]  Chief Complaint  Patient presents with  . Follow-up    HPI: Gabriel Gamble is a 29 y.o. male who is status post right scrotal exploration and right orchiectomy yesterday with Dr. Lonna Cobb who presents today for dressing change.   Brief history: a prior history of torsion of the right spermatic cord and underwent exploration and bilateral orchiopexy by Dr. Apolinar Junes on 02/22/2020.  The right testis visually appeared ischemic however with reduction the appearance improved to a mottled light pink consistent with a potentially salvageable testis.  It was felt to be at high risk for wound dehiscence based on inflammation in appearance of the scrotal skin. His drain was removed on 9/24 and the incision was intact.  Approximately 1 week ago he had progressive opening of his incision and thought he was able to visualize a structure that he thought to be the testicle.  He did not seek care until day prior to procedure.    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 Irineo Axon.  He had not acute events overnight.    PMH: No past medical history on file.  Surgical History: Past Surgical History:  Procedure Laterality Date  . ORCHIECTOMY Right 02/21/2020   Procedure: Reduction of Testicular torsion;  Surgeon: Vanna Scotland, MD;  Location: [facility];  Service: Urology;  Laterality: Right;  . ORCHIECTOMY Right 03/12/2020   Procedure: ORCHIECTOMY;  Surgeon: Riki Altes, MD;  Location: [facility];  Service: Urology;  Laterality: Right;  . ORCHIOPEXY Bilateral 02/21/2020   Procedure: ORCHIOPEXY ADULT;  Surgeon: Vanna Scotland, MD;  Location: [facility];  Service: Urology;  Laterality: Bilateral;  . SCROTAL EXPLORATION N/A 02/21/2020   Procedure: SCROTUM  EXPLORATION;  Surgeon: Vanna Scotland, MD;  Location: [facility];  Service: Urology;  Laterality: N/A;  . SCROTAL EXPLORATION Right 03/12/2020   Procedure: SCROTUM EXPLORATION;  Surgeon: Riki Altes, MD;  Location: [facility];  Service: Urology;  Laterality: Right;    Home Medications:  Allergies as of 03/15/2020   No Known Allergies     Medication List       Accurate as of March 15, 2020  9:00 AM. If you have any questions, ask your nurse or doctor.        docusate sodium 100 MG capsule Commonly known as: COLACE Take 1 capsule (100 mg total) by mouth 2 (two) times daily.   HYDROcodone-acetaminophen 5-325 MG tablet Commonly known as: NORCO/VICODIN Take 1-2 tablets by mouth every 6 (six) hours as needed for moderate pain.   sulfamethoxazole-trimethoprim 800-160 MG tablet Commonly known as: BACTRIM DS Take 1 tablet by mouth 2 (two) times daily for 5 days.       Allergies: No Known Allergies  Family History: No family history on file.  Social History:  reports that he has never smoked. He has never used smokeless tobacco. He reports previous alcohol use. He reports that he does not use drugs.  ROS: Pertinent ROS in HPI  Physical Exam: Constitutional:  Well nourished. Alert and oriented, No acute distress. HEENT: [place] AT, mask in place.  Trachea midline Cardiovascular: No clubbing, cyanosis, or edema. Respiratory: Normal respiratory effort, no increased work of breathing. GU: Patient with circumcised phallus.  Urethral meatus is patent.  No penile discharge. No penile lesions  or rashes.  Right hemiscrotum approximated sutured anteriorly with healthy granulating tissues on the edges.  Inferior wound without purulent drainage with healthy granulating tissue inside.  No crepitus, no purulent drainage, no erythema and no fluctuant mass appreciated.  The left testicle is located scrotally. It is nontender.  Left epididymitis is normal.   Lymph: No inguinal  adenopathy. Neurologic: Grossly intact, no focal deficits, moving all 4 extremities. Psychiatric: Normal mood and affect.  Laboratory Data: Lab Results  Component Value Date   WBC 7.9 02/21/2020   HGB 12.5 (L) 02/21/2020   HCT 39.8 02/21/2020   MCV 69.9 (L) 02/21/2020   PLT 245 02/21/2020    Lab Results  Component Value Date   CREATININE 1.01 02/21/2020   Urinalysis No results found for: COLORURINE, APPEARANCEUR, LABSPEC, PHURINE, GLUCOSEU, HGBUR, BILIRUBINUR, KETONESUR, PROTEINUR, UROBILINOGEN, NITRITE, LEUKOCYTESUR  I have reviewed the labs.   Pertinent Imaging: N/A  Packing is removed to reveal healthy granulation tissue.  The packing is examined and is with dried blood.  The wound is then irrigated with sterile water.  I then repacked using a 2 inch x 75 inch stretch bandage soaked in sterile water.  It is covered by wet to dry dressing.  Supportive underwear is worn over the dressing.   Assessment & Plan:    1.  Necrotic right testes Patient is status post scrotal exploration and orchiectomy  Dressing changes completed I have extracted the patient to pack his wound over the weekend daily using wet-to-dry 4 x 4 gauzes.  I have given him sterile water, a sterile bowel and syringe for irrigation and instructed him on how to perform dressing changes. Patient return Monday for another dressing exchange He is advised to take a pain medication and have a driver prior to his appointment Red flag signs reviewed.   Return for Return Monday for dressing change .  These notes generated with voice recognition software. I apologize for typographical errors.  Michiel Cowboy, PA-C  [facility] [address]  [address] [place], [place] [address] [phone]

## 2020-03-15 ENCOUNTER — Ambulatory Visit (INDEPENDENT_AMBULATORY_CARE_PROVIDER_SITE_OTHER): Payer: BC Managed Care – PPO | Admitting: Urology

## 2020-03-15 DIAGNOSIS — Z4801 Encounter for change or removal of surgical wound dressing: Secondary | ICD-10-CM

## 2020-03-18 ENCOUNTER — Other Ambulatory Visit: Payer: Self-pay

## 2020-03-18 ENCOUNTER — Ambulatory Visit: Payer: Self-pay | Admitting: Physician Assistant

## 2020-03-18 ENCOUNTER — Encounter: Payer: Self-pay | Admitting: Physician Assistant

## 2020-03-18 ENCOUNTER — Ambulatory Visit (INDEPENDENT_AMBULATORY_CARE_PROVIDER_SITE_OTHER): Payer: BC Managed Care – PPO | Admitting: Physician Assistant

## 2020-03-18 VITALS — BP 137/73 | HR 81 | Ht 74.0 in | Wt 176.0 lb

## 2020-03-18 DIAGNOSIS — Z4801 Encounter for change or removal of surgical wound dressing: Secondary | ICD-10-CM

## 2020-03-18 NOTE — Progress Notes (Signed)
Patient presented to clinic today for dressing change.  He is POD 6 from scrotal exploration with debridement and right orchiectomy with Dr. Lonna Cobb.  He has been performing wet-to-dry dressing changes at home and notes he is tolerating these well. On physical exam, wound appears to have healthy granulation tissue at the base. The remaining nylon sutures approximating the superior edge with minimal cavity available to except packing at the inferior apex. I placed a wet-to-dry 4 x 4 gauze pad in the inferior aspect of the wound and covered it with a dry 4 x 4 gauze pad. Counseled patient to continue daily wet-to-dry dressings at home. We will plan for suture removal in 2 weeks. Counseled patient to return to clinic sooner with new fever, chills, nausea, vomiting, purulent output, pain, or wound reopening. He expressed understanding.  Return in about 2 weeks (around 04/01/2020) for suture removal.

## 2020-03-26 ENCOUNTER — Ambulatory Visit: Payer: Self-pay | Admitting: Urology

## 2020-04-01 NOTE — Progress Notes (Signed)
04/02/2020 9:01 AM   Gabriel Gamble 03-Dec-1990 [id number]  Referring provider: Eartha Inch, MD [address] [place],  [place] [address]  Chief Complaint  Patient presents with  . Dressing Change    HPI: Gabriel Gamble is a 29 y.o. male who is status post right scrotal exploration and right orchiectomy on 03/12/2020 with Dr. Lonna Cobb who presents today suture removal.    Brief history: a prior history of torsion of the right spermatic cord and underwent exploration and bilateral orchiopexy by Dr. Apolinar Junes on 02/22/2020.  The right testis visually appeared ischemic however with reduction the appearance improved to a mottled light pink consistent with a potentially salvageable testis.  It was felt to be at high risk for wound dehiscence based on inflammation in appearance of the scrotal skin. His drain was removed on 9/24 and the incision was intact.  Approximately 1 week ago he had progressive opening of his incision and thought he was able to visualize a structure that he thought to be the testicle.  He did not seek care until day prior to procedure.    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 Irineo Axon.    He has been successful in keeping the wound clean and dry and packing the wound as instructed.  Over the last several days, he has not had the packed wound as the cavity has closed.   PMH: No past medical history on file.  Surgical History: Past Surgical History:  Procedure Laterality Date  . ORCHIECTOMY Right 02/21/2020   Procedure: Reduction of Testicular torsion;  Surgeon: Vanna Scotland, MD;  Location: [facility];  Service: Urology;  Laterality: Right;  . ORCHIECTOMY Right 03/12/2020   Procedure: ORCHIECTOMY;  Surgeon: Riki Altes, MD;  Location: [facility];  Service: Urology;  Laterality: Right;  . ORCHIOPEXY Bilateral 02/21/2020   Procedure: ORCHIOPEXY  ADULT;  Surgeon: Vanna Scotland, MD;  Location: [facility];  Service: Urology;  Laterality: Bilateral;  . SCROTAL EXPLORATION N/A 02/21/2020   Procedure: SCROTUM EXPLORATION;  Surgeon: Vanna Scotland, MD;  Location: [facility];  Service: Urology;  Laterality: N/A;  . SCROTAL EXPLORATION Right 03/12/2020   Procedure: SCROTUM EXPLORATION;  Surgeon: Riki Altes, MD;  Location: [facility];  Service: Urology;  Laterality: Right;    Home Medications:  Allergies as of 04/02/2020   No Known Allergies     Medication List       Accurate as of April 02, 2020  9:01 AM. If you have any questions, ask your nurse or doctor.        docusate sodium 100 MG capsule Commonly known as: COLACE Take 1 capsule (100 mg total) by mouth 2 (two) times daily.   HYDROcodone-acetaminophen 5-325 MG tablet Commonly known as: NORCO/VICODIN Take 1-2 tablets by mouth every 6 (six) hours as needed for moderate pain.       Allergies: No Known Allergies  Family History: No family history on file.  Social History:  reports that he has never smoked. He has never used smokeless tobacco. He reports previous alcohol use. He reports that he does not use drugs.  ROS: Pertinent ROS in HPI  Physical Exam: Constitutional:  Well nourished. Alert and oriented, No acute distress. HEENT: [place] AT, mask in place  Trachea midline Cardiovascular: No clubbing, cyanosis, or edema. Respiratory: Normal respiratory effort, no increased work of breathing. GU: Patient with circumcised phallus.  Urethral meatus  is patent.  No penile discharge. No penile lesions or rashes.  Right hemiscrotum is approximated with suture anteriorly with healthy granulating tissue on the edges.  The in inferior wound is closed with granulation tissue around the edges.  No drainage is expressed with palpation, no crepitus is noted, no scrotal erythema is noted and no fluctuant masses palpated.  Left hemiscrotum without lesions, cysts, rashes and/or edema.   Left testicle is located scrotally.  No masses are appreciated in the testicle. Right epididymis isnormal. Neurologic: Grossly intact, no focal deficits, moving all 4 extremities. Psychiatric: Normal mood and affect.  Laboratory Data: Lab Results  Component Value Date   WBC 7.9 02/21/2020   HGB 12.5 (L) 02/21/2020   HCT 39.8 02/21/2020   MCV 69.9 (L) 02/21/2020   PLT 245 02/21/2020    Lab Results  Component Value Date   CREATININE 1.01 02/21/2020   Urinalysis No results found for: COLORURINE, APPEARANCEUR, LABSPEC, PHURINE, GLUCOSEU, HGBUR, BILIRUBINUR, KETONESUR, PROTEINUR, UROBILINOGEN, NITRITE, LEUKOCYTESUR  I have reviewed the labs.  Approximation sutures are removed at today's visit  Pertinent Imaging: N/A    Assessment & Plan:    1.  Necrotic right testes Patient is status post scrotal exploration and orchiectomy  Approximation sutures are removed Wound continues to heal nicely Patient return in 1 month for wound recheck  Return in about 1 month (around 05/02/2020) for recheck.  These notes generated with voice recognition software. I apologize for typographical errors.  Michiel Cowboy, PA-C  [facility] [address]  [address] [place], [place] [address] [phone]

## 2020-04-02 ENCOUNTER — Ambulatory Visit (INDEPENDENT_AMBULATORY_CARE_PROVIDER_SITE_OTHER): Payer: BC Managed Care – PPO | Admitting: Urology

## 2020-04-02 ENCOUNTER — Encounter: Payer: Self-pay | Admitting: Urology

## 2020-04-02 ENCOUNTER — Other Ambulatory Visit: Payer: Self-pay

## 2020-04-02 VITALS — BP 152/93 | HR 82 | Ht 74.0 in | Wt 173.0 lb

## 2020-04-02 DIAGNOSIS — N5089 Other specified disorders of the male genital organs: Secondary | ICD-10-CM

## 2020-05-02 NOTE — Progress Notes (Deleted)
05/03/2020 8:23 AM   [name] 20-Aug-1990 [id number]  Referring provider: Eartha Inch, MD [address] [place],  [place] [address]  No chief complaint on file.   HPI: Gabriel Gamble is a 29 y.o. male who is status post right scrotal exploration and right orchiectomy on 03/12/2020 with Dr. Lonna Cobb who presents today suture removal.    Brief history: a prior history of torsion of the right spermatic cord and underwent exploration and bilateral orchiopexy by Dr. Apolinar Junes on 02/22/2020.  The right testis visually appeared ischemic however with reduction the appearance improved to a mottled light pink consistent with a potentially salvageable testis.  It was felt to be at high risk for wound dehiscence based on inflammation in appearance of the scrotal skin. His drain was removed on 9/24 and the incision was intact.  Approximately 1 week ago he had progressive opening of his incision and thought he was able to visualize a structure that he thought to be the testicle.  He did not seek care until day prior to procedure.    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 Irineo Axon.    He has been successful in keeping the wound clean and dry and packing the wound as instructed.  Over the last several days, he has not had the packed wound as the cavity has closed.   PMH: No past medical history on file.  Surgical History: Past Surgical History:  Procedure Laterality Date  . ORCHIECTOMY Right 02/21/2020   Procedure: Reduction of Testicular torsion;  Surgeon: Vanna Scotland, MD;  Location: [facility];  Service: Urology;  Laterality: Right;  . ORCHIECTOMY Right 03/12/2020   Procedure: ORCHIECTOMY;  Surgeon: Riki Altes, MD;  Location: [facility];  Service: Urology;  Laterality: Right;  . ORCHIOPEXY Bilateral 02/21/2020   Procedure: ORCHIOPEXY ADULT;  Surgeon: Vanna Scotland, MD;   Location: [facility];  Service: Urology;  Laterality: Bilateral;  . SCROTAL EXPLORATION N/A 02/21/2020   Procedure: SCROTUM EXPLORATION;  Surgeon: Vanna Scotland, MD;  Location: [facility];  Service: Urology;  Laterality: N/A;  . SCROTAL EXPLORATION Right 03/12/2020   Procedure: SCROTUM EXPLORATION;  Surgeon: Riki Altes, MD;  Location: [facility];  Service: Urology;  Laterality: Right;    Home Medications:  Allergies as of 05/03/2020   No Known Allergies     Medication List       Accurate as of May 02, 2020  8:23 AM. If you have any questions, ask your nurse or doctor.        docusate sodium 100 MG capsule Commonly known as: COLACE Take 1 capsule (100 mg total) by mouth 2 (two) times daily.   HYDROcodone-acetaminophen 5-325 MG tablet Commonly known as: NORCO/VICODIN Take 1-2 tablets by mouth every 6 (six) hours as needed for moderate pain.       Allergies: No Known Allergies  Family History: No family history on file.  Social History:  reports that he has never smoked. He has never used smokeless tobacco. He reports previous alcohol use. He reports that he does not use drugs.  ROS: Pertinent ROS in HPI  Physical Exam: Constitutional:  Well nourished. Alert and oriented, No acute distress. HEENT: [place] AT, mask in place  Trachea midline Cardiovascular: No clubbing, cyanosis, or edema. Respiratory: Normal respiratory effort, no increased work of breathing. GU: Patient with circumcised phallus.  Urethral meatus is patent.  No penile discharge.  No penile lesions or rashes.  Right hemiscrotum is approximated with suture anteriorly with healthy granulating tissue on the edges.  The in inferior wound is closed with granulation tissue around the edges.  No drainage is expressed with palpation, no crepitus is noted, no scrotal erythema is noted and no fluctuant masses palpated.  Left hemiscrotum without lesions, cysts, rashes and/or edema.  Left testicle is located scrotally.  No  masses are appreciated in the testicle. Right epididymis isnormal. Neurologic: Grossly intact, no focal deficits, moving all 4 extremities. Psychiatric: Normal mood and affect.  Laboratory Data: Lab Results  Component Value Date   WBC 7.9 02/21/2020   HGB 12.5 (L) 02/21/2020   HCT 39.8 02/21/2020   MCV 69.9 (L) 02/21/2020   PLT 245 02/21/2020    Lab Results  Component Value Date   CREATININE 1.01 02/21/2020   Urinalysis No results found for: COLORURINE, APPEARANCEUR, LABSPEC, PHURINE, GLUCOSEU, HGBUR, BILIRUBINUR, KETONESUR, PROTEINUR, UROBILINOGEN, NITRITE, LEUKOCYTESUR  I have reviewed the labs.  Approximation sutures are removed at today's visit  Pertinent Imaging: N/A    Assessment & Plan:    1.  Necrotic right testes Patient is status post scrotal exploration and orchiectomy  Approximation sutures are removed Wound continues to heal nicely Patient return in 1 month for wound recheck  No follow-ups on file.  These notes generated with voice recognition software. I apologize for typographical errors.  Michiel Cowboy, PA-C  [facility] [address]  [address] [place], [place] [address] [phone]

## 2020-05-03 ENCOUNTER — Encounter: Payer: Self-pay | Admitting: Urology

## 2020-05-03 ENCOUNTER — Ambulatory Visit: Payer: BC Managed Care – PPO | Admitting: Urology

## 2020-05-03 DIAGNOSIS — N5089 Other specified disorders of the male genital organs: Secondary | ICD-10-CM

## 2020-06-20 DIAGNOSIS — N5089 Other specified disorders of the male genital organs: Secondary | ICD-10-CM | POA: Insufficient documentation

## 2020-06-20 NOTE — Progress Notes (Signed)
06/21/2020 11:49 AM   Gabriel Gamble 1990/09/07 [id number]  Referring provider: Eartha Inch, MD [address] [place],  [place] [address]  Chief Complaint  Patient presents with  . SEXUALLY TRANSMITTED DISEASE   Urological history 1. 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 male who presents today for possible chlamydia exposure.    Patient states he recently has found out his sexual partner tested positive for chlamydia and he is experiencing penile discharge and dysuria.  His UA is clear.  PMH: No past medical history on file.  Surgical History: Past Surgical History:  Procedure Laterality Date  . ORCHIECTOMY Right 02/21/2020   Procedure: Reduction of Testicular torsion;  Surgeon: Vanna Scotland, MD;  Location: [facility];  Service: Urology;  Laterality: Right;  . ORCHIECTOMY Right 03/12/2020   Procedure: ORCHIECTOMY;  Surgeon: Riki Altes, MD;  Location: [facility];  Service: Urology;  Laterality: Right;  . ORCHIOPEXY Bilateral 02/21/2020   Procedure: ORCHIOPEXY ADULT;  Surgeon: Vanna Scotland, MD;  Location: [facility];  Service: Urology;  Laterality: Bilateral;  . SCROTAL EXPLORATION N/A 02/21/2020   Procedure: SCROTUM EXPLORATION;  Surgeon: Vanna Scotland, MD;  Location: [facility];  Service: Urology;  Laterality: N/A;  . SCROTAL EXPLORATION Right 03/12/2020   Procedure: SCROTUM EXPLORATION;  Surgeon: Riki Altes, MD;  Location: [facility];  Service: Urology;  Laterality: Right;    Home Medications:  Allergies as of 06/21/2020   No Known Allergies     Medication List       Accurate as of June 21, 2020 11:59 PM. If you have any questions, ask your nurse or doctor.        STOP taking these medications   docusate sodium 100 MG capsule Commonly known as: COLACE Stopped by: Vergil Burby, PA-C   HYDROcodone-acetaminophen 5-325 MG tablet Commonly known as:  NORCO/VICODIN Stopped by: Michiel Cowboy, PA-C     TAKE these medications   doxycycline 100 MG tablet Commonly known as: VIBRA-TABS Take 1 tablet (100 mg total) by mouth 2 (two) times daily. Started by: Michiel Cowboy, PA-C       Allergies: No Known Allergies  Family History: No family history on file.  Social History:  reports that he has never smoked. He has never used smokeless tobacco. He reports previous alcohol use. He reports that he does not use drugs.  ROS: Pertinent ROS in HPI  Physical Exam: BP (!) 158/78 (BP Location: Left Arm, Patient Position: Sitting, Cuff Size: Normal)   Pulse 76   Ht 6\' 2"  (1.88 m)   Wt 173 lb (78.5 kg)   BMI 22.21 kg/m   Constitutional:  Well nourished. Alert and oriented, No acute distress. HEENT: [place] AT, mask in place.  Trachea midline Cardiovascular: No clubbing, cyanosis, or edema. Respiratory: Normal respiratory effort, no increased work of breathing. Neurologic: Grossly intact, no focal deficits, moving all 4 extremities. Psychiatric: Normal mood and affect.  Laboratory Data: Component     Latest Ref Rng & Units 06/21/2020  Specific Gravity, UA     1.005 - 1.030 >1.030 (H)  pH, UA     5.0 - 7.5 6.0  Color, UA     Yellow Yellow  Appearance Ur     Clear Clear  Leukocytes,UA     Negative Negative  Protein,UA     Negative/Trace Negative  Glucose, UA     Negative Negative  Ketones, UA  Negative Negative  RBC, UA     Negative Negative  Bilirubin, UA     Negative Negative  Urobilinogen, Ur     0.2 - 1.0 mg/dL 0.2  Nitrite, UA     Negative Negative  Microscopic Examination      See below:   Component     Latest Ref Rng & Units 06/21/2020  WBC, UA     0 - 5 /hpf 0-5  RBC     0 - 2 /hpf None seen  Epithelial Cells (non renal)     0 - 10 /hpf None seen  Bacteria, UA     None seen/Few None seen  I have reviewed the labs.   Pertinent Imaging: No recent imagine  Assessment & Plan:    1. Testicular  necrosis - s/p orchiectomy   2. STI exposure -Urine sent for culture, GC/chlamydia and mycoplasma/Ureaplasma culture -Also obtain blood work for HIV and RPR -Started doxycycline 100 mg twice daily for 7 days  Return for Pending lab work results.  These notes generated with voice recognition software. I apologize for typographical errors.  Michiel Cowboy, PA-C  [facility] [address]  [address] [place], [place] [address] [phone]

## 2020-06-21 ENCOUNTER — Ambulatory Visit (INDEPENDENT_AMBULATORY_CARE_PROVIDER_SITE_OTHER): Payer: BC Managed Care – PPO | Admitting: Urology

## 2020-06-21 ENCOUNTER — Other Ambulatory Visit: Payer: Self-pay

## 2020-06-21 ENCOUNTER — Encounter: Payer: Self-pay | Admitting: Urology

## 2020-06-21 VITALS — BP 158/78 | HR 76 | Ht 74.0 in | Wt 173.0 lb

## 2020-06-21 DIAGNOSIS — N5089 Other specified disorders of the male genital organs: Secondary | ICD-10-CM

## 2020-06-21 DIAGNOSIS — R300 Dysuria: Secondary | ICD-10-CM

## 2020-06-21 DIAGNOSIS — A64 Unspecified sexually transmitted disease: Secondary | ICD-10-CM | POA: Diagnosis not present

## 2020-06-21 LAB — URINALYSIS, COMPLETE
Bilirubin, UA: NEGATIVE
Glucose, UA: NEGATIVE
Ketones, UA: NEGATIVE
Leukocytes,UA: NEGATIVE
Nitrite, UA: NEGATIVE
Protein,UA: NEGATIVE
RBC, UA: NEGATIVE
Specific Gravity, UA: >1.03 — ABNORMAL HIGH (ref 1.005–1.030)
Urobilinogen, Ur: 0.2 mg/dL (ref 0.2–1.0)
pH, UA: 6 (ref 5.0–7.5)

## 2020-06-21 LAB — MICROSCOPIC EXAMINATION
Bacteria, UA: NONE SEEN
Epithelial Cells (non renal): NONE SEEN /hpf (ref 0–10)
RBC, Urine: NONE SEEN /hpf (ref 0–2)

## 2020-06-21 MED ORDER — DOXYCYCLINE HYCLATE 100 MG PO TABS
100.0000 mg | ORAL_TABLET | Freq: Two times a day (BID) | ORAL | 0 refills | Status: DC
Start: 2020-06-21 — End: 2020-07-02

## 2020-06-21 NOTE — Patient Instructions (Signed)
Chlamydia, Male Chlamydia is an STI (sexually transmitted infection) that is caused by bacteria. This infection spreads through sexual contact. Chlamydia can occur in different areas of the body, including:  The urethra. This is the part of the body that drains urine from the bladder and through the penis.  The throat.  The rectum. This condition is not difficult to treat. However, if left untreated, chlamydia can lead to more serious health problems. What are the causes? This condition is caused by the bacteria Chlamydia trachomatis. The bacteria are spread from an infected partner during sexual activity. Chlamydia can spread through contact with the genitals, mouth, or rectum. What increases the risk? The following factors may make you more likely to develop this condition:  Not using a condom correctly or not using a condom every time you have sex.  Having a new sex partner or having more than one sex partner.  Being a man who has sex with men (MSM). What are the signs or symptoms? In some cases, there are no symptoms, especially early in the infection. Having no symptoms is also called being asymptomatic. If symptoms develop, they may include:  Urinating often, or having a burning feeling during urination.  Pain or swelling in the testicles.  Watery, mucus-like discharge from the penis.  Redness, soreness, swelling, bleeding, or discharge from the rectum. This may occur if the infection was spread through anal sex.  Pain in the abdomen.  Itching, burning, or redness in the eyes, or discharge from the eyes.  Pain during sex. How is this diagnosed? This condition may be diagnosed with:  Urine tests.  Swab tests. Depending on your symptoms, your health care provider may use a cotton swab to collect discharge from your urethra or rectum to test for the bacteria. How is this treated? This condition is treated with antibiotic medicines. Follow these instructions at  home: Sexual activity  Tell your sex partner or partners about your infection. These include any partners for oral, anal, or vaginal sex that you have had within 60 days of when your symptoms started. Sex partners should also be treated, even if they have no signs of the disease.  Do not have sex until you and your sex partners have completed treatment and your health care provider says it is okay. If your health care provider prescribed you a single-dose medicine as treatment, wait at least 7 days after taking the medicine before having sex. General instructions  Take over-the-counter and prescription medicines as told by your health care provider. Finish all antibiotic medicine even when you start to feel better.  It is up to you to get your test results. Ask your health care provider, or the department that is doing the test, when your results will be ready.  Get plenty of rest.  Eat a healthy, well-balanced diet.  Drink enough fluids to keep your urine pale yellow.  Keep all follow-up visits as told by your health care provider. This is important. You may need to be tested for infection again 3 months after treatment. How is this prevented? The only sure way to prevent chlamydia is to avoid having vaginal, anal, and oral sex. However, you can lower your risk by:  Using latex condoms correctly every time you have sex.  Not having multiple sexual partners.  Asking if your sex partner has been tested for STIs and had negative results.  Getting regular health screenings to check for STIs.   Contact a health care provider if:  You develop new symptoms or your symptoms do not get better after you complete treatment.  You have a fever or chills.  You have pain during sex.  You develop new joint pain or swelling near your joints.  You have pain or soreness in your testicles. Get help right away if:  Your pain gets worse and does not get better with medicine.  You have abnormal  discharge.  You develop flu-like symptoms, such as night sweats, sore throat, or muscle aches. Summary  Chlamydia is an STI (sexually transmitted infection) that is caused by bacteria. This infection spreads through sexual contact.  This condition is not difficult to treat. However, if left untreated, it can lead to more serious health problems.  Some people have no symptoms (are asymptomatic), especially early in the infection.  This condition is treated with antibiotic medicines.  Using latex condoms correctly every time you have sex can help prevent chlamydia. This information is not intended to replace advice given to you by your health care provider. Make sure you discuss any questions you have with your health care provider. Document Revised: 06/05/2019 Document Reviewed: 06/05/2019 Elsevier Patient Education  2021 [employer].

## 2020-06-22 LAB — HIV ANTIBODY (ROUTINE TESTING W REFLEX): HIV Screen 4th Generation wRfx: NONREACTIVE

## 2020-06-22 LAB — RPR: RPR Ser Ql: NONREACTIVE

## 2020-06-24 LAB — CULTURE, URINE COMPREHENSIVE: Organism ID, Bacteria: NO GROWTH

## 2020-06-25 LAB — GC/CHLAMYDIA PROBE AMP
Chlamydia trachomatis, NAA: NEGATIVE
Neisseria Gonorrhoeae by PCR: NEGATIVE

## 2020-06-27 LAB — MYCOPLASMA / UREAPLASMA CULTURE
Mycoplasma hominis Culture: NEGATIVE
Ureaplasma urealyticum: NEGATIVE

## 2020-07-02 ENCOUNTER — Other Ambulatory Visit: Payer: Self-pay

## 2020-07-03 MED ORDER — DOXYCYCLINE HYCLATE 100 MG PO TABS
100.0000 mg | ORAL_TABLET | Freq: Two times a day (BID) | ORAL | 0 refills | Status: AC
Start: 2020-07-03 — End: ?

## 2021-06-17 IMAGING — US US SCROTUM W/ DOPPLER COMPLETE
1 series · 12 of 25 positions shown · non-contrast
Comparison: None.
COMPARISON: None.
COMPARISON: None.

Addendum:
CLINICAL DATA: Right testicular pain

EXAM:
SCROTAL ULTRASOUND
DOPPLER ULTRASOUND OF THE TESTICLES
TECHNIQUE: Complete ultrasound examination of the testicles, epididymis, and
other scrotal structures was performed. Color and spectral Doppler
ultrasound were also utilized to evaluate blood flow to the
testicles.

[Series 1: us scrotum w/doppler · 57 acquisitions, 12 frames shown]
[im 3/57]
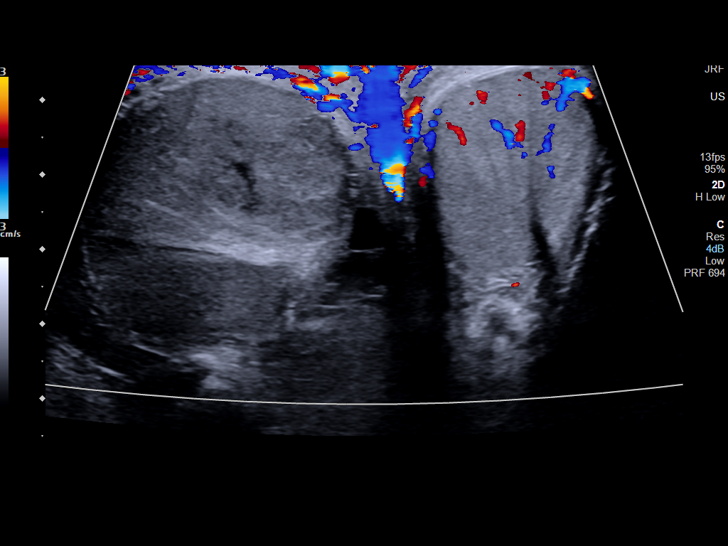
[im 8/57]
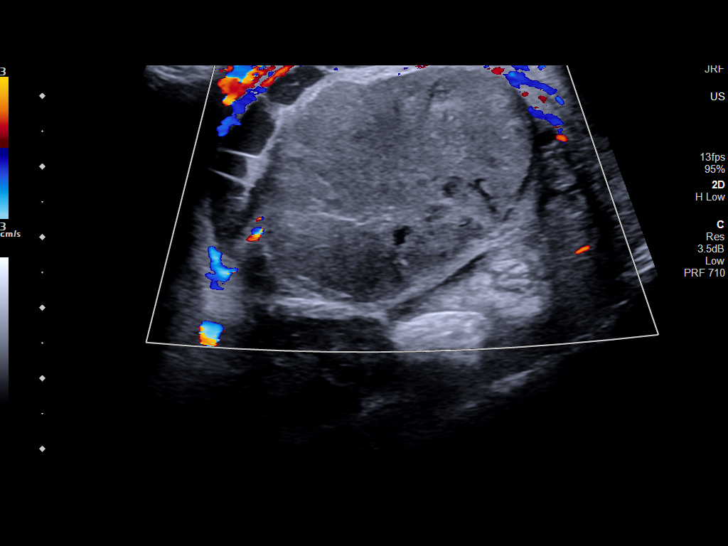
[im 12/57]
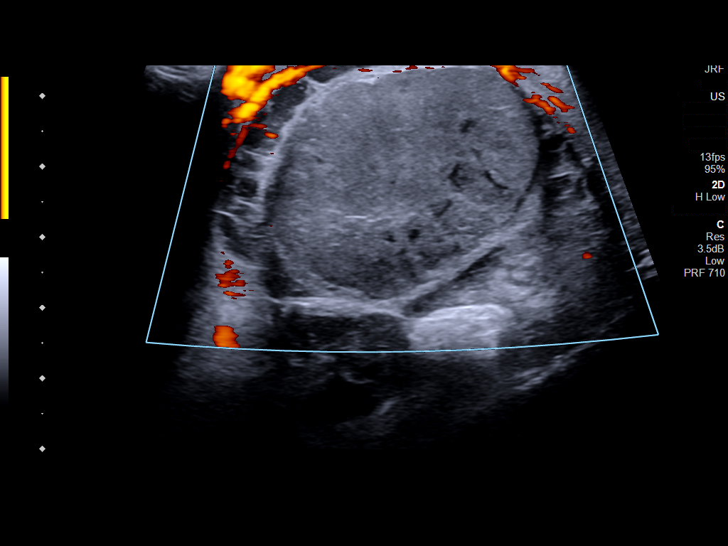
[im 17/57]
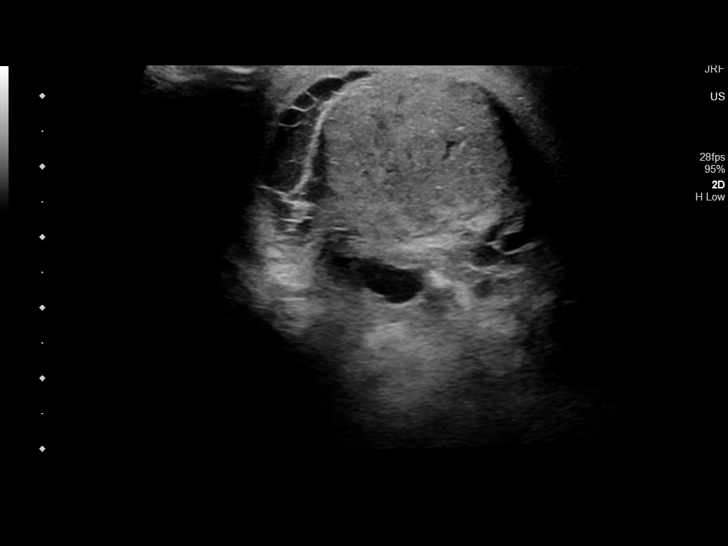
[im 22/57]
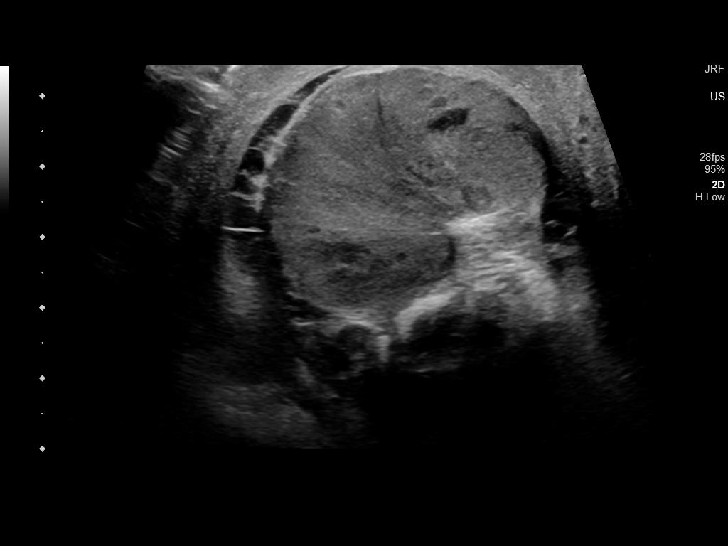
[im 26/57]
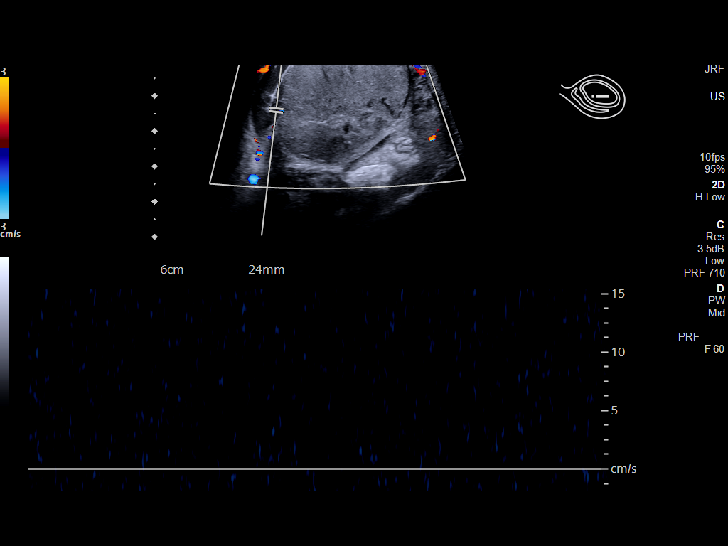
[im 31/57]
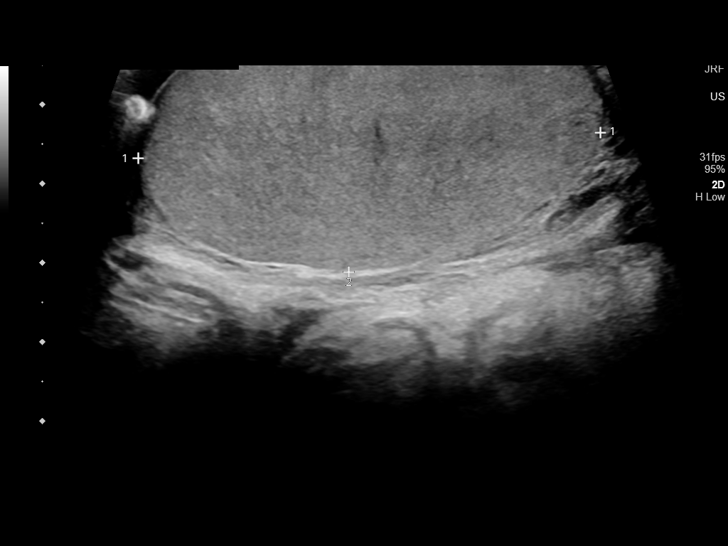
[im 36/57]
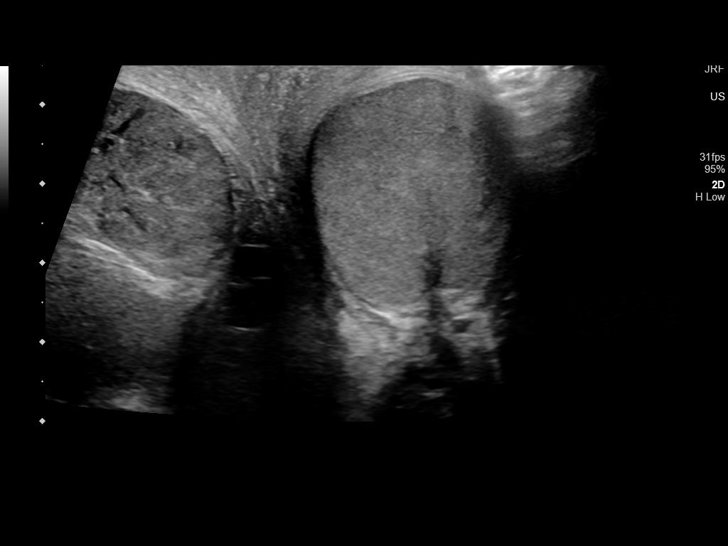
[im 40/57]
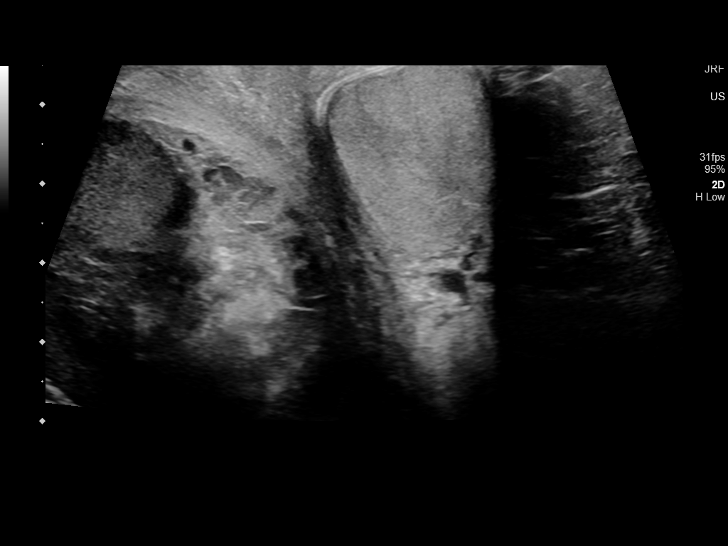
[im 45/57]
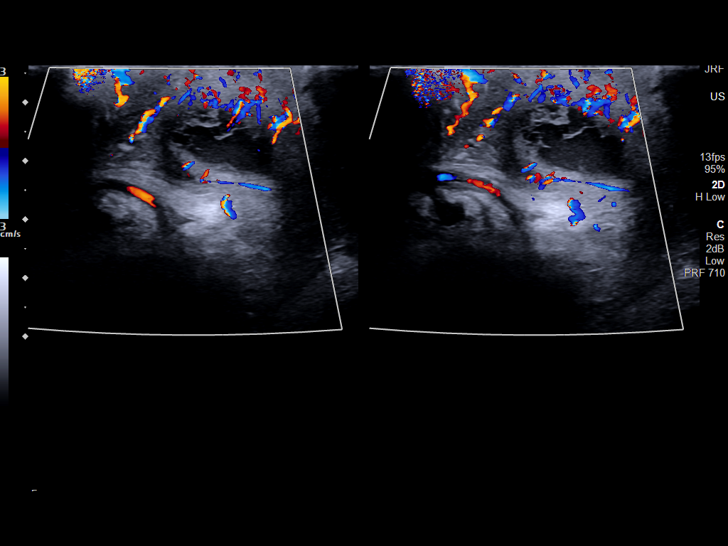
[im 50/57]
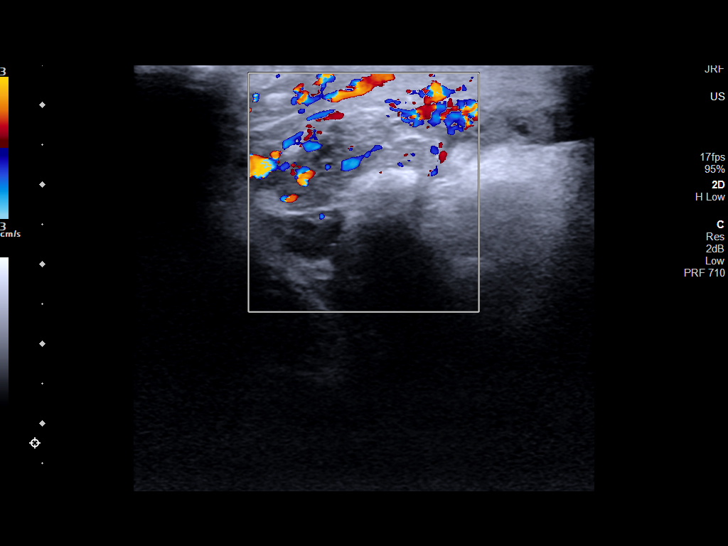
[im 54/57]
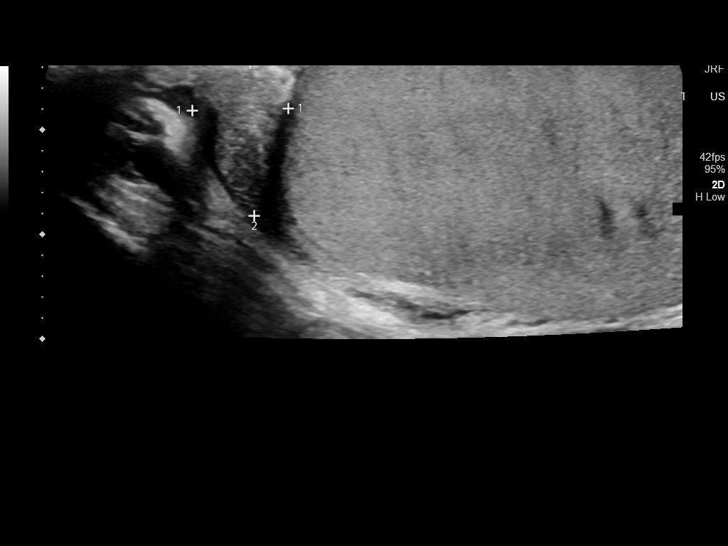

[12 of 25 positions shown; findings below may reference images not displayed]

FINDINGS: Right testicle

Measurements: 4.3 x 3.2 x 4 cm. The right testicle is heterogeneous
without evidence for a testicular mass.

Left testicle

Measurements: 5.9 x 2.8 x 2.6 cm. No mass or microlithiasis
visualized.

Right epididymis:  Normal in size and appearance.

Left epididymis:  Normal in size and appearance.

Hydrocele:  There is a complex right-sided hydrocele.

Varicocele:  None visualized.

Pulsed Doppler interrogation of both testes demonstrates normal low
resistance arterial and venous waveforms bilaterally.

There is asymmetric scrotal wall thickening involving the right
hemiscrotum.
IMPRESSION: Findings are consistent with right-sided testicular torsion.

ADDENDUM:
These results were called by telephone at the time of interpretation
on 02/21/2020 at [DATE] to provider HOUSE NAM , who verbally
acknowledged these results.

ADDENDUM:
There is an error in the body of the report. It should state that
pulsed Doppler interrogation of both testicles demonstrates normal
venous and arterial waveforms within the left testicle. However on
the right, no venous or arterial waveforms could be obtained. On
power Doppler, no flow is seen within the right testicle. These
findings are consistent with RIGHT-sided testicular torsion.

These findings were discussed with Dr. Henocka at the time of the
correction of this report.

*** End of Addendum ***
Addendum:
FINDINGS: Right testicle

Measurements: 4.3 x 3.2 x 4 cm. The right testicle is heterogeneous
without evidence for a testicular mass.

Left testicle

Measurements: 5.9 x 2.8 x 2.6 cm. No mass or microlithiasis
visualized.

Right epididymis:  Normal in size and appearance.

Left epididymis:  Normal in size and appearance.

Hydrocele:  There is a complex right-sided hydrocele.

Varicocele:  None visualized.

Pulsed Doppler interrogation of both testes demonstrates normal low
resistance arterial and venous waveforms bilaterally.

There is asymmetric scrotal wall thickening involving the right
hemiscrotum.
IMPRESSION: Findings are consistent with right-sided testicular torsion.

ADDENDUM:
These results were called by telephone at the time of interpretation
on 02/21/2020 at [DATE] to provider HOUSE NAM , who verbally
acknowledged these results.

*** End of Addendum ***
FINDINGS: Right testicle

Measurements: 4.3 x 3.2 x 4 cm. The right testicle is heterogeneous
without evidence for a testicular mass.

Left testicle

Measurements: 5.9 x 2.8 x 2.6 cm. No mass or microlithiasis
visualized.

Right epididymis:  Normal in size and appearance.

Left epididymis:  Normal in size and appearance.

Hydrocele:  There is a complex right-sided hydrocele.

Varicocele:  None visualized.

Pulsed Doppler interrogation of both testes demonstrates normal low
resistance arterial and venous waveforms bilaterally.

There is asymmetric scrotal wall thickening involving the right
hemiscrotum.
IMPRESSION: Findings are consistent with right-sided testicular torsion.
